# Patient Record
Sex: Female | Born: 1980
Health system: Southern US, Community
[De-identification: ages and names within clinical notes are randomized; demographics above are authoritative.]

## PROBLEM LIST (undated history)

## (undated) DIAGNOSIS — J45909 Unspecified asthma, uncomplicated: Secondary | ICD-10-CM

---

## 2010-08-02 ENCOUNTER — Emergency Department: Payer: Self-pay | Admitting: Emergency Medicine

## 2010-10-11 ENCOUNTER — Emergency Department: Payer: Self-pay | Admitting: Emergency Medicine

## 2013-08-30 ENCOUNTER — Emergency Department: Payer: Self-pay | Admitting: Emergency Medicine

## 2013-09-28 ENCOUNTER — Emergency Department: Payer: Self-pay | Admitting: Internal Medicine

## 2013-11-09 ENCOUNTER — Emergency Department: Payer: Self-pay | Admitting: Emergency Medicine

## 2013-11-18 ENCOUNTER — Emergency Department: Payer: Self-pay | Admitting: Emergency Medicine

## 2013-11-28 ENCOUNTER — Emergency Department: Payer: Self-pay | Admitting: Emergency Medicine

## 2014-01-17 ENCOUNTER — Emergency Department: Payer: Self-pay

## 2014-01-17 LAB — URINALYSIS, COMPLETE
BLOOD: NEGATIVE
Bacteria: NONE SEEN
Glucose,UR: NEGATIVE mg/dL (ref 0–75)
LEUKOCYTE ESTERASE: NEGATIVE
Nitrite: NEGATIVE
PH: 6 (ref 4.5–8.0)
Protein: NEGATIVE
RBC,UR: 2 /HPF (ref 0–5)
SPECIFIC GRAVITY: 1.024 (ref 1.003–1.030)
Squamous Epithelial: 4
WBC UR: 3 /HPF (ref 0–5)

## 2014-05-11 ENCOUNTER — Emergency Department: Payer: Self-pay | Admitting: Emergency Medicine

## 2014-06-03 ENCOUNTER — Emergency Department: Payer: Self-pay | Admitting: Emergency Medicine

## 2014-06-07 ENCOUNTER — Emergency Department: Payer: Self-pay | Admitting: Emergency Medicine

## 2014-08-12 DIAGNOSIS — J4520 Mild intermittent asthma, uncomplicated: Secondary | ICD-10-CM | POA: Insufficient documentation

## 2014-08-12 DIAGNOSIS — Z720 Tobacco use: Secondary | ICD-10-CM | POA: Insufficient documentation

## 2014-08-17 ENCOUNTER — Emergency Department
Admit: 2014-08-17 | Disposition: A | Discharge status: Home / Self Care | Payer: Self-pay | Admitting: Emergency Medicine

## 2014-08-17 LAB — URINALYSIS, COMPLETE
Bilirubin,UR: NEGATIVE
Glucose,UR: NEGATIVE mg/dL (ref 0–75)
KETONE: NEGATIVE
Leukocyte Esterase: NEGATIVE
Nitrite: NEGATIVE
PROTEIN: NEGATIVE
Ph: 5 (ref 4.5–8.0)
SPECIFIC GRAVITY: 1.024 (ref 1.003–1.030)

## 2014-08-17 LAB — CBC
HCT: 38.6 % (ref 35.0–47.0)
HGB: 13.1 g/dL (ref 12.0–16.0)
MCH: 32.4 pg (ref 26.0–34.0)
MCHC: 33.8 g/dL (ref 32.0–36.0)
MCV: 96 fL (ref 80–100)
Platelet: 227 10*3/uL (ref 150–440)
RBC: 4.03 10*6/uL (ref 3.80–5.20)
RDW: 13.5 % (ref 11.5–14.5)
WBC: 7.7 10*3/uL (ref 3.6–11.0)

## 2014-08-17 LAB — WET PREP, GENITAL

## 2015-03-12 ENCOUNTER — Emergency Department: Payer: Self-pay

## 2015-03-12 ENCOUNTER — Emergency Department
Admission: EM | Admit: 2015-03-12 | Discharge: 2015-03-12 | Disposition: A | Discharge status: Home / Self Care | Payer: Self-pay | Attending: Emergency Medicine | Admitting: Emergency Medicine

## 2015-03-12 DIAGNOSIS — Y9389 Activity, other specified: Secondary | ICD-10-CM | POA: Insufficient documentation

## 2015-03-12 DIAGNOSIS — Y9289 Other specified places as the place of occurrence of the external cause: Secondary | ICD-10-CM | POA: Insufficient documentation

## 2015-03-12 DIAGNOSIS — Y998 Other external cause status: Secondary | ICD-10-CM | POA: Insufficient documentation

## 2015-03-12 DIAGNOSIS — S63601A Unspecified sprain of right thumb, initial encounter: Secondary | ICD-10-CM | POA: Insufficient documentation

## 2015-03-12 DIAGNOSIS — W010XXA Fall on same level from slipping, tripping and stumbling without subsequent striking against object, initial encounter: Secondary | ICD-10-CM | POA: Insufficient documentation

## 2015-03-12 NOTE — ED Notes (Signed)
Pt states she was moving her living room around had the couch standing up states she tripped over her dog and the cough fell she tried to catch it and when that happened her thumb "went all the way back" pt states she is able to move it but it hurts "a lot"

## 2015-03-12 NOTE — ED Provider Notes (Signed)
[facility] Emergency Department Provider Note ____________________________________________  Time seen: Approximately 3:31 PM  I have reviewed the triage vital signs and the nursing notes.   HISTORY  Chief Complaint Hand Pain  HPI Toni Armstrong is a 34 y.o. female is here with complaint of right thumb pain. Patient states that she was moving about her living room when she tripped over her dog and she fell trying to catch herself. There was an injury against her thumb causing her thumb to hyperextend. Patient states she has not had a fracture to her thumb. Patient continues to have pain in this area. Pain is constant, nonradiatingand increases with range of motion. Patient states she has not taken any over-the-counter medication prior to her arrival in the emergency room. She rates her pain is 7 out of 10. Patient denies any head injury or loss of consciousness during this accident.   No past medical history on file.  There are no active problems to display for this patient.   No past surgical history on file.  No current outpatient prescriptions on file.  Allergies Review of patient's allergies indicates no known allergies.  No family history on file.  Social History Social History  Substance Use Topics  . Smoking status: Not on file  . Smokeless tobacco: Not on file  . Alcohol Use: Not on file    Review of Systems Constitutional: No fever/chills Eyes: No visual changes. ENT: No sore throat. Cardiovascular: Denies chest pain. Respiratory: Denies shortness of breath. Gastrointestinal:   No nausea, no vomiting.   Musculoskeletal: Negative for back pain. Positive right hand pain. Skin: Negative for rash. Neurological: Negative for headaches, focal weakness or numbness.  10-point ROS otherwise negative.  ____________________________________________   PHYSICAL EXAM:  VITAL SIGNS: ED Triage Vitals  Enc Vitals Group     BP 03/12/15 1510  129/76 mmHg     Pulse Rate 03/12/15 1510 84     Resp 03/12/15 1510 18     Temp 03/12/15 1510 98.1 F (36.7 C)     Temp Source 03/12/15 1510 Oral     SpO2 03/12/15 1510 100 %     Weight 03/12/15 1510 218 lb (98.884 kg)     Height 03/12/15 1510 5\' 6"  (1.676 m)     Head Cir --      Peak Flow --      Pain Score 03/12/15 1506 7     Pain Loc --      Pain Edu? --      Excl. in GC? --     Constitutional: Alert and oriented. Well appearing and in no acute distress. Eyes: Conjunctivae are normal. PERRL. EOMI. Head: Atraumatic. Nose: No congestion/rhinnorhea. Mouth/Throat: Mucous membranes are moist.  Oropharynx non-erythematous. Neck: No stridor. Cardiovascular: Normal rate, regular rhythm. Grossly normal heart sounds.  Good peripheral circulation. Respiratory: Normal respiratory effort.  No retractions. Lungs CTAB. Gastrointestinal: Soft and nontender. No distention. No abdominal bruits. No CVA tenderness. Musculoskeletal: Right hand no gross deformity. There is decreased range of motion in the right thumb secondary to pain. There is minimal edema present in the right thumb. Motor sensory function intact. Neurologic:  Normal speech and language. No gross focal neurologic deficits are appreciated. No gait instability. Skin:  Skin is warm, dry and intact. No rash noted. Right thumb no ecchymosis, erythema, or abrasions were noted. Psychiatric: Mood and affect are normal. Speech and behavior are normal.  ____________________________________________   LABS (all labs ordered are listed, but only abnormal  results are displayed)  Labs Reviewed - No data to display  RADIOLOGY  X-ray right hand per radiologist does show any fracture or dislocation. I, Tommi Rumps, personally viewed and evaluated these images (plain radiographs) as part of my medical decision making.  ____________________________________________   PROCEDURES  Procedure(s) performed: None  Critical Care performed:  No  ____________________________________________   INITIAL IMPRESSION / ASSESSMENT AND PLAN / ED COURSE  Pertinent labs & imaging results that were available during my care of the patient were reviewed by me and considered in my medical decision making (see chart for details).  Patient's hand was wrapped with an Ace wrap. Patient is to ice and elevate hand as needed for pain and swelling. She is instructed to use ibuprofen as needed for pain. She is follow-up with [facility] clinic if any continued problems.   ____________________________________________   FINAL CLINICAL IMPRESSION(S) / ED DIAGNOSES  Final diagnoses:  Sprain of right thumb, initial encounter      Tommi Rumps, PA-C 03/12/15 1621  Emily Filbert, MD 03/13/15 1517

## 2015-03-12 NOTE — ED Notes (Signed)
Pt to triage with reports of rt hand thumb pain since moving a couch.

## 2015-03-12 NOTE — Discharge Instructions (Signed)
Finger Sprain A finger sprain is a tear in one of the strong, fibrous tissues that connect the bones (ligaments) in your finger. The severity of the sprain depends on how much of the ligament is torn. The tear can be either partial or complete. CAUSES  Often, sprains are a result of a fall or accident. If you extend your hands to catch an object or to protect yourself, the force of the impact causes the fibers of your ligament to stretch too much. This excess tension causes the fibers of your ligament to tear. SYMPTOMS  You may have some loss of motion in your finger. Other symptoms include:  Bruising.  Tenderness.  Swelling. DIAGNOSIS  In order to diagnose finger sprain, your caregiver will physically examine your finger or thumb to determine how torn the ligament is. Your caregiver may also suggest an X-ray exam of your finger to make sure no bones are broken. TREATMENT  If your ligament is only partially torn, treatment usually involves keeping the finger in a fixed position (immobilization) for a short period. To do this, your caregiver will apply a bandage, cast, or splint to keep your finger from moving until it heals. For a partially torn ligament, the healing process usually takes 2 to 3 weeks. If your ligament is completely torn, you may need surgery to reconnect the ligament to the bone. After surgery a cast or splint will be applied and will need to stay on your finger or thumb for 4 to 6 weeks while your ligament heals. HOME CARE INSTRUCTIONS  Keep your injured finger elevated, when possible, to decrease swelling.  To ease pain and swelling, apply ice to your joint twice a day, for 2 to 3 days:  Put ice in a plastic bag.  Place a towel between your skin and the bag.  Leave the ice on for 15 minutes.  Only take over-the-counter or prescription medicine for pain as directed by your caregiver.  Do not wear rings on your injured finger.  Do not leave your finger unprotected  until pain and stiffness go away (usually 3 to 4 weeks).  Do not allow your cast or splint to get wet. Cover your cast or splint with a plastic bag when you shower or bathe. Do not swim.  Your caregiver may suggest special exercises for you to do during your recovery to prevent or limit permanent stiffness. SEEK IMMEDIATE MEDICAL CARE IF:  Your cast or splint becomes damaged.  Your pain becomes worse rather than better. MAKE SURE YOU:  Understand these instructions.  Will watch your condition.  Will get help right away if you are not doing well or get worse.   This information is not intended to replace advice given to you by your health care provider. Make sure you discuss any questions you have with your health care provider.   Document Released: 05/25/2004 Document Revised: 05/08/2014 Document Reviewed: 12/19/2010 Elsevier Interactive Patient Education 2016 [employer]    ICE AND ELEVATE YOUR HAND IBUPROFEN AS NEEDED FOR PAIN

## 2015-05-18 ENCOUNTER — Emergency Department
Admission: EM | Admit: 2015-05-18 | Discharge: 2015-05-18 | Disposition: A | Discharge status: Home / Self Care | Payer: BLUE CROSS/BLUE SHIELD | Attending: Emergency Medicine | Admitting: Emergency Medicine

## 2015-05-18 ENCOUNTER — Encounter: Payer: Self-pay | Admitting: *Deleted

## 2015-05-18 DIAGNOSIS — R0602 Shortness of breath: Secondary | ICD-10-CM | POA: Diagnosis present

## 2015-05-18 DIAGNOSIS — J45901 Unspecified asthma with (acute) exacerbation: Secondary | ICD-10-CM | POA: Diagnosis not present

## 2015-05-18 DIAGNOSIS — F17200 Nicotine dependence, unspecified, uncomplicated: Secondary | ICD-10-CM | POA: Diagnosis not present

## 2015-05-18 HISTORY — DX: Unspecified asthma, uncomplicated: J45.909

## 2015-05-18 MED ORDER — PREDNISONE 20 MG PO TABS: 60.0000 mg | ORAL_TABLET | Freq: Every day | ORAL | Status: DC

## 2015-05-18 MED ORDER — IPRATROPIUM-ALBUTEROL 0.5-2.5 (3) MG/3ML IN SOLN
9.0000 mL | Freq: Once | RESPIRATORY_TRACT | Status: AC
  Administered 2015-05-18: 9 mL via RESPIRATORY_TRACT
  Filled 2015-05-18: qty 9

## 2015-05-18 MED ORDER — PREDNISONE 20 MG PO TABS
60.0000 mg | ORAL_TABLET | Freq: Once | ORAL | Status: AC
  Administered 2015-05-18: 60 mg via ORAL
  Filled 2015-05-18: qty 3

## 2015-05-18 NOTE — ED Notes (Signed)
At work developed sob used her inhaler and went outside, her sob is some better, yesterday had sorethroat

## 2015-05-18 NOTE — Discharge Instructions (Signed)
Asthma, Adult Asthma is a condition of the lungs in which the airways tighten and narrow. Asthma can make it hard to breathe. Asthma cannot be cured, but medicine and lifestyle changes can help control it. Asthma may be started (triggered) by:  Animal skin flakes (dander).  Dust.  Cockroaches.  Pollen.  Mold.  Smoke.  Cleaning products.  Hair sprays or aerosol sprays.  Paint fumes or strong smells.  Cold air, weather changes, and winds.  Crying or laughing hard.  Stress.  Certain medicines or drugs.  Foods, such as dried fruit, potato chips, and sparkling grape juice.  Infections or conditions (colds, flu).  Exercise.  Certain medical conditions or diseases.  Exercise or tiring activities. HOME CARE   Take medicine as told by your doctor.  Use a peak flow meter as told by your doctor. A peak flow meter is a tool that measures how well the lungs are working.  Record and keep track of the peak flow meter's readings.  Understand and use the asthma action plan. An asthma action plan is a written plan for taking care of your asthma and treating your attacks.  To help prevent asthma attacks:  Do not smoke. Stay away from secondhand smoke.  Change your heating and air conditioning filter often.  Limit your use of fireplaces and wood stoves.  Get rid of pests (such as roaches and mice) and their droppings.  Throw away plants if you see mold on them.  Clean your floors. Dust regularly. Use cleaning products that do not smell.  Have someone vacuum when you are not home. Use a vacuum cleaner with a HEPA filter if possible.  Replace carpet with wood, tile, or vinyl flooring. Carpet can trap animal skin flakes and dust.  Use allergy-proof pillows, mattress covers, and box spring covers.  Wash bed sheets and blankets every week in hot water and dry them in a dryer.  Use blankets that are made of polyester or cotton.  Clean bathrooms and kitchens with bleach.  If possible, have someone repaint the walls in these rooms with mold-resistant paint. Keep out of the rooms that are being cleaned and painted.  Wash hands often. GET HELP IF:  You have make a whistling sound when breaking (wheeze), have shortness of breath, or have a cough even if taking medicine to prevent attacks.  The colored mucus you cough up (sputum) is thicker than usual.  The colored mucus you cough up changes from clear or white to yellow, green, gray, or bloody.  You have problems from the medicine you are taking such as:  A rash.  Itching.  Swelling.  Trouble breathing.  You need reliever medicines more than 2-3 times a week.  Your peak flow measurement is still at 50-79% of your personal best after following the action plan for 1 hour.  You have a fever. GET HELP RIGHT AWAY IF:   You seem to be worse and are not responding to medicine during an asthma attack.  You are short of breath even at rest.  You get short of breath when doing very little activity.  You have trouble eating, drinking, or talking.  You have chest pain.  You have a fast heartbeat.  Your lips or fingernails start to turn blue.  You are light-headed, dizzy, or faint.  Your peak flow is less than 50% of your personal best.   This information is not intended to replace advice given to you by your health care provider. Make sure   you discuss any questions you have with your health care provider.   Document Released: 10/04/2007 Document Revised: 01/06/2015 Document Reviewed: 11/14/2012 Elsevier Interactive Patient Education 2016 Elsevier Inc.  

## 2015-05-18 NOTE — ED Notes (Signed)
States breathing much easier, lungs clear

## 2015-05-18 NOTE — ED Provider Notes (Addendum)
[facility] Emergency Department Provider Note  ____________________________________________  Time seen: Seen upon arrival to the emergency department  I have reviewed the triage vital signs and the nursing notes.   HISTORY  Chief Complaint Shortness of Breath    HPI Toni Armstrong is a 35 y.o. female with a history of asthma who is presenting today with shortness of breath that started suddenly at work just prior to arrival. She denies having cough or runny nose but did say [place] get a cold. She denies any chest pain at this time. Said that she was standing at work when her shortness of breath started suddenly. She then took 4 puffs off of her inhaler and has had significant improvement in her shortness of breath. EMS said that she was not wheezing for them and has not required any oxygen in route. The patient says that the sudden onset of shortness of breath is typical of her past asthma attacks and has had these presentations multiple times previously. She said that she had a mild sore throat yesterday but it has resolved and there is no pain today.The patient says that her only pain is a tightness in her face which she says that she gets whenever she has her asthma exacerbations.   Past Medical History  Diagnosis Date  . Asthma   . Seizures (HCC)     There are no active problems to display for this patient.   History reviewed. No pertinent past surgical history.  No current outpatient prescriptions on file.  Allergies Review of patient's allergies indicates no known allergies.  No family history on file.  Social History Social History  Substance Use Topics  . Smoking status: Current Every Day Smoker  . Smokeless tobacco: None  . Alcohol Use: No    Review of Systems Constitutional: No fever/chills Eyes: No visual changes. ENT: As above Cardiovascular: Denies chest  pain. Respiratory: As above  Gastrointestinal: No abdominal pain.  No nausea, no vomiting.  No diarrhea.  No constipation. Genitourinary: Negative for dysuria. Musculoskeletal: Negative for back pain. Skin: Negative for rash. Neurological: Negative forf ocal weakness or numbness.  10-point ROS otherwise negative.  ____________________________________________   PHYSICAL EXAM:  VITAL SIGNS: ED Triage Vitals  Enc Vitals Group     BP 05/18/15 0840 128/74 mmHg     Pulse Rate 05/18/15 0840 87     Resp 05/18/15 0840 23     Temp 05/18/15 0840 97.7 F (36.5 C)     Temp Source 05/18/15 0840 Oral     SpO2 05/18/15 0840 100 %     Weight 05/18/15 0840 220 lb (99.791 kg)     Height 05/18/15 0840  (1.676 m)     Head Cir --      Peak Flow --      Pain Score --      Pain Loc --      Pain Edu? --      Excl. in GC? --     Constitutional: Alert and oriented. Well appearing and in no acute distress. Eyes: Conjunctivae are normal. PERRL. EOMI. Head: Atraumatic. Nose: No congestion/rhinnorhea. Mouth/Throat: Mucous membranes are moist.  Oropharynx non-erythematous. Neck: No stridor.   Cardiovascular: Normal rate, regular rhythm. Grossly normal heart sounds.  Good peripheral circulation. Respiratory: Mild tachypnea but without any labored respirations .  No retractions. Lungs CTAB but with a prolonged expiratory phase. Gastrointestinal: Soft  and nontender. No distention. No abdominal bruits. No CVA tenderness. Musculoskeletal: No lower extremity tenderness nor edema.  No joint effusions. Neurologic:  Normal speech and language. No gross focal neurologic deficits are appreciated. No gait instability. Skin:  Skin is warm, dry and intact. No rash noted. Psychiatric: Mood and affect are normal. Speech and behavior are normal.  ____________________________________________   LABS (all labs ordered are listed, but only abnormal results are displayed)  Labs Reviewed - No data to  display ____________________________________________  EKG   ____________________________________________  RADIOLOGY   ____________________________________________   PROCEDURES    ____________________________________________   INITIAL IMPRESSION / ASSESSMENT AND PLAN / ED COURSE  Pertinent labs & imaging results that were available during my care of the patient were reviewed by me and considered in my medical decision making (see chart for details). Symptoms typical of previous asthma flares. We'll treat with nebulizers as well as steroids and reassess.  ----------------------------------------- 10:30 AM on 05/18/2015 -----------------------------------------  Patient now feeling back to baseline. Respiratory rate of 18. No pain reported at this time. We auscultated her lungs and they are clear without any wheezing. There is no prolonged expiration phase. She is not in respiratory distress and is speaking in full sentences. She has had multiple previous presentations of her asthma which is been identical. I'll give her steroids to go home with. She has an inhaler at home already that she will use as needed. She does not report a care doctor but does have insurance to make it easier for her to follow-up. [facility] and I'll give her the number that she may make an appointment. She'll send the plan is when to comply. Nose she may return to the emergency department for any worsening or concerning symptoms at any time. ____________________________________________   FINAL CLINICAL IMPRESSION(S) / ED DIAGNOSES  Acute asthma exacerbation.    Myrna Blazer, MD 05/18/15 1031  Patient with mild tachycardia but likely secondary to nebulizer treatments.  Myrna Blazer, MD 05/18/15 [phone]

## 2015-05-21 ENCOUNTER — Emergency Department
Admission: EM | Admit: 2015-05-21 | Discharge: 2015-05-21 | Disposition: A | Discharge status: Home / Self Care | Payer: BLUE CROSS/BLUE SHIELD | Attending: Emergency Medicine | Admitting: Emergency Medicine

## 2015-05-21 ENCOUNTER — Emergency Department: Payer: BLUE CROSS/BLUE SHIELD

## 2015-05-21 DIAGNOSIS — F17200 Nicotine dependence, unspecified, uncomplicated: Secondary | ICD-10-CM | POA: Insufficient documentation

## 2015-05-21 DIAGNOSIS — J069 Acute upper respiratory infection, unspecified: Secondary | ICD-10-CM | POA: Insufficient documentation

## 2015-05-21 DIAGNOSIS — Z7952 Long term (current) use of systemic steroids: Secondary | ICD-10-CM | POA: Diagnosis not present

## 2015-05-21 DIAGNOSIS — J45901 Unspecified asthma with (acute) exacerbation: Secondary | ICD-10-CM | POA: Diagnosis not present

## 2015-05-21 DIAGNOSIS — R0981 Nasal congestion: Secondary | ICD-10-CM | POA: Diagnosis present

## 2015-05-21 MED ORDER — DM-GUAIFENESIN ER 30-600 MG PO TB12
1.0000 | ORAL_TABLET | ORAL | Status: DC
  Filled 2015-05-21: qty 1

## 2015-05-21 MED ORDER — AZITHROMYCIN 250 MG PO TABS
500.0000 mg | ORAL_TABLET | Freq: Once | ORAL | Status: AC
  Administered 2015-05-21: 500 mg via ORAL
  Filled 2015-05-21: qty 2

## 2015-05-21 MED ORDER — DM-GUAIFENESIN ER 30-600 MG PO TB12: 1.0000 | ORAL_TABLET | Freq: Two times a day (BID) | ORAL | Status: DC

## 2015-05-21 MED ORDER — DM-GUAIFENESIN ER 30-600 MG PO TB12: 1.0000 | ORAL_TABLET | Freq: Two times a day (BID) | ORAL | Status: DC | PRN

## 2015-05-21 MED ORDER — GUAIFENESIN ER 600 MG PO TB12
600.0000 mg | ORAL_TABLET | Freq: Once | ORAL | Status: DC
  Filled 2015-05-21: qty 1

## 2015-05-21 MED ORDER — AZITHROMYCIN 500 MG PO TABS: 500.0000 mg | ORAL_TABLET | Freq: Every day | ORAL | Status: AC

## 2015-05-21 NOTE — ED Notes (Signed)
Pt in with co congestion, shob, vomiting, and chills. Pt is currently on prednisone for asthma attack Tuesday, she was seen here.

## 2015-05-21 NOTE — ED Provider Notes (Signed)
[facility] Emergency Department Provider Note  ____________________________________________  Time seen: 3:40AM  I have reviewed the triage vital signs and the nursing notes.   HISTORY  Chief Complaint Nasal Congestion     HPI Toni Armstrong is a 35 y.o. female presents with nasal congestion and dyspnea and chills 3 days. Patient was seen emergency Department on Tuesday 3 days ago for an asthma attack and prescribed prednisone. Patient states symptoms have not improved and congestion has become worse.    Past Medical History  Diagnosis Date  . Asthma   . Seizures (HCC)     There are no active problems to display for this patient.   No past surgical history on file.  Current Outpatient Rx  Name  Route  Sig  Dispense  Refill  . predniSONE (DELTASONE) 20 MG tablet   Oral   Take 3 tablets (60 mg total) by mouth daily with breakfast.   12 tablet   0     Allergies Review of patient's allergies indicates no known allergies.  No family history on file.  Social History Social History  Substance Use Topics  . Smoking status: Current Every Day Smoker  . Smokeless tobacco: Not on file  . Alcohol Use: No    Review of Systems  Constitutional: Negative for fever. Positive for chills Eyes: Negative for visual changes. ENT: Negative for sore throat. Positive for nasal congestion Cardiovascular: Negative for chest pain. Respiratory: Negative for shortness of breath. Positive for cough Gastrointestinal: Negative for abdominal pain, vomiting and diarrhea. Genitourinary: Negative for dysuria. Musculoskeletal: Negative for back pain. Skin: Negative for rash. Neurological: Negative for headaches, focal weakness or numbness.   10-point ROS otherwise negative.  ____________________________________________   PHYSICAL EXAM:  VITAL SIGNS: ED Triage Vitals  Enc Vitals Group     BP 05/21/15 0321 121/73 mmHg     Pulse Rate 05/21/15 0321 77   Resp 05/21/15 0321 18     Temp 05/21/15 0321 97.9 F (36.6 C)     Temp Source 05/21/15 0321 Oral     SpO2 05/21/15 0321 100 %     Weight 05/21/15 0321 220 lb (99.791 kg)     Height 05/21/15 0321  (1.676 m)     Head Cir --      Peak Flow --      Pain Score 05/21/15 0322 6     Pain Loc --      Pain Edu? --      Excl. in GC? --      Constitutional: Alert and oriented. Well appearing and in no distress. Eyes: Conjunctivae are normal. PERRL. Normal extraocular movements. ENT   Head: Normocephalic and atraumatic.   Nose: No congestion/rhinnorhea.   Mouth/Throat: Mucous membranes are moist.   Neck: No stridor. Hematological/Lymphatic/Immunilogical: No cervical lymphadenopathy. Cardiovascular: Normal rate, regular rhythm. Normal and symmetric distal pulses are present in all extremities. No murmurs, rubs, or gallops. Respiratory: Normal respiratory effort without tachypnea nor retractions. Breath sounds are clear and equal bilaterally. No wheezes/rales/rhonchi. Gastrointestinal: Soft and nontender. No distention. There is no CVA tenderness. Genitourinary: deferred Musculoskeletal: Nontender with normal range of motion in all extremities. No joint effusions.  No lower extremity tenderness nor edema. Neurologic:  Normal speech and language. No gross focal neurologic deficits are appreciated. Speech is normal.  Skin:  Skin is warm, dry and intact. No rash noted. Psychiatric: Mood and affect are normal. Speech and behavior are normal. Patient exhibits appropriate insight and judgment.  RADIOLOGY  DG Chest 2 View (Final result) Result time: 05/21/15 04:27:41   Final result by Rad Results In Interface (05/21/15 04:27:41)   Narrative:   CLINICAL DATA: Congestion, shortness of breath, vomiting and chills. On prednisone for asthma attack.  EXAM: CHEST 2 VIEW  COMPARISON: Chest radiograph May 11, 2014  FINDINGS: Cardiomediastinal silhouette is normal. Mild  bronchitic changes. The lungs are clear without pleural effusions or focal consolidations. Trachea projects midline and there is no pneumothorax. Soft tissue planes and included osseous structures are normal.  IMPRESSION: Mild bronchitic changes can be seen with reactive airway disease or bronchiolitis without focal consolidation.   Electronically Signed By: Awilda Metro M.D. On: 05/21/2015 04:27          INITIAL IMPRESSION / ASSESSMENT AND PLAN / ED COURSE  Pertinent labs & imaging results that were available during my care of the patient were reviewed by me and considered in my medical decision making (see chart for details).  Patient received azithromycin and Mucinex and will be prescribed the same for home  ____________________________________________   FINAL CLINICAL IMPRESSION(S) / ED DIAGNOSES  Final diagnoses:  URI (upper respiratory infection)   Bronchitis    Darci Current, MD 05/21/15 [phone]

## 2015-05-21 NOTE — Discharge Instructions (Signed)
Upper Respiratory Infection, Adult Most upper respiratory infections (URIs) are a viral infection of the air passages leading to the lungs. A URI affects the nose, throat, and upper air passages. The most common type of URI is nasopharyngitis and is typically referred to as "the common cold." URIs run their course and usually go away on their own. Most of the time, a URI does not require medical attention, but sometimes a bacterial infection in the upper airways can follow a viral infection. This is called a secondary infection. Sinus and middle ear infections are common types of secondary upper respiratory infections. Bacterial pneumonia can also complicate a URI. A URI can worsen asthma and chronic obstructive pulmonary disease (COPD). Sometimes, these complications can require emergency medical care and may be life threatening.  CAUSES Almost all URIs are caused by viruses. A virus is a type of germ and can spread from one person to another.  RISKS FACTORS You may be at risk for a URI if:   You smoke.   You have chronic heart or lung disease.  You have a weakened defense (immune) system.   You are very young or very old.   You have nasal allergies or asthma.  You work in crowded or poorly ventilated areas.  You work in health care facilities or schools. SIGNS AND SYMPTOMS  Symptoms typically develop 2-3 days after you come in contact with a cold virus. Most viral URIs last 7-10 days. However, viral URIs from the influenza virus (flu virus) can last 14-18 days and are typically more severe. Symptoms may include:   Runny or stuffy (congested) nose.   Sneezing.   Cough.   Sore throat.   Headache.   Fatigue.   Fever.   Loss of appetite.   Pain in your forehead, behind your eyes, and over your cheekbones (sinus pain).  Muscle aches.  DIAGNOSIS  Your health care provider may diagnose a URI by:  Physical exam.  Tests to check that your symptoms are not due to  another condition such as:  Strep throat.  Sinusitis.  Pneumonia.  Asthma. TREATMENT  A URI goes away on its own with time. It cannot be cured with medicines, but medicines may be prescribed or recommended to relieve symptoms. Medicines may help:  Reduce your fever.  Reduce your cough.  Relieve nasal congestion. HOME CARE INSTRUCTIONS   Take medicines only as directed by your health care provider.   Gargle warm saltwater or take cough drops to comfort your throat as directed by your health care provider.  Use a warm mist humidifier or inhale steam from a shower to increase air moisture. This may make it easier to breathe.  Drink enough fluid to keep your urine clear or pale yellow.   Eat soups and other clear broths and maintain good nutrition.   Rest as needed.   Return to work when your temperature has returned to normal or as your health care provider advises. You may need to stay home longer to avoid infecting others. You can also use a face mask and careful hand washing to prevent spread of the virus.  Increase the usage of your inhaler if you have asthma.   Do not use any tobacco products, including cigarettes, chewing tobacco, or electronic cigarettes. If you need help quitting, ask your health care provider. PREVENTION  The best way to protect yourself from getting a cold is to practice good hygiene.   Avoid oral or hand contact with people with cold   symptoms.   Wash your hands often if contact occurs.  There is no clear evidence that vitamin C, vitamin E, echinacea, or exercise reduces the chance of developing a cold. However, it is always recommended to get plenty of rest, exercise, and practice good nutrition.  SEEK MEDICAL CARE IF:   You are getting worse rather than better.   Your symptoms are not controlled by medicine.   You have chills.  You have worsening shortness of breath.  You have Mical Kicklighter or red mucus.  You have yellow or Arleny Kruger nasal  discharge.  You have pain in your face, especially when you bend forward.  You have a fever.  You have swollen neck glands.  You have pain while swallowing.  You have white areas in the back of your throat. SEEK IMMEDIATE MEDICAL CARE IF:   You have severe or persistent:  Headache.  Ear pain.  Sinus pain.  Chest pain.  You have chronic lung disease and any of the following:  Wheezing.  Prolonged cough.  Coughing up blood.  A change in your usual mucus.  You have a stiff neck.  You have changes in your:  Vision.  Hearing.  Thinking.  Mood. MAKE SURE YOU:   Understand these instructions.  Will watch your condition.  Will get help right away if you are not doing well or get worse.   This information is not intended to replace advice given to you by your health care provider. Make sure you discuss any questions you have with your health care provider.   Document Released: 10/11/2000 Document Revised: 09/01/2014 Document Reviewed: 07/23/2013 Elsevier Interactive Patient Education 2016 [employer]vier Inc.  

## 2016-02-29 ENCOUNTER — Emergency Department: Payer: PRIVATE HEALTH INSURANCE

## 2016-02-29 ENCOUNTER — Emergency Department
Admission: EM | Admit: 2016-02-29 | Discharge: 2016-02-29 | Disposition: A | Discharge status: Home / Self Care | Payer: PRIVATE HEALTH INSURANCE | Attending: Emergency Medicine | Admitting: Emergency Medicine

## 2016-02-29 ENCOUNTER — Encounter: Payer: Self-pay | Admitting: Emergency Medicine

## 2016-02-29 DIAGNOSIS — Z79899 Other long term (current) drug therapy: Secondary | ICD-10-CM | POA: Diagnosis not present

## 2016-02-29 DIAGNOSIS — J45909 Unspecified asthma, uncomplicated: Secondary | ICD-10-CM | POA: Diagnosis not present

## 2016-02-29 DIAGNOSIS — Y999 Unspecified external cause status: Secondary | ICD-10-CM | POA: Insufficient documentation

## 2016-02-29 DIAGNOSIS — S0990XA Unspecified injury of head, initial encounter: Secondary | ICD-10-CM | POA: Diagnosis present

## 2016-02-29 DIAGNOSIS — Y9389 Activity, other specified: Secondary | ICD-10-CM | POA: Insufficient documentation

## 2016-02-29 DIAGNOSIS — M25562 Pain in left knee: Secondary | ICD-10-CM | POA: Diagnosis not present

## 2016-02-29 DIAGNOSIS — M25561 Pain in right knee: Secondary | ICD-10-CM | POA: Diagnosis not present

## 2016-02-29 DIAGNOSIS — F17200 Nicotine dependence, unspecified, uncomplicated: Secondary | ICD-10-CM | POA: Insufficient documentation

## 2016-02-29 DIAGNOSIS — Y92410 Unspecified street and highway as the place of occurrence of the external cause: Secondary | ICD-10-CM | POA: Insufficient documentation

## 2016-02-29 DIAGNOSIS — S060X0A Concussion without loss of consciousness, initial encounter: Secondary | ICD-10-CM | POA: Insufficient documentation

## 2016-02-29 MED ORDER — HYDROCODONE-ACETAMINOPHEN 5-325 MG PO TABS: 1.0000 | ORAL_TABLET | Freq: Four times a day (QID) | ORAL | 0 refills | Status: DC | PRN

## 2016-02-29 MED ORDER — HYDROCODONE-ACETAMINOPHEN 5-325 MG PO TABS
2.0000 | ORAL_TABLET | ORAL | Status: AC
  Administered 2016-02-29: 2 via ORAL
  Filled 2016-02-29: qty 2
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
## 2016-02-29 NOTE — ED Notes (Signed)
AAOx3.  Skin warm and dry.  Abrasion to top center of forehead.  Moving all extremities equally and strong.

## 2016-03-13 ENCOUNTER — Emergency Department
Admission: EM | Admit: 2016-03-13 | Discharge: 2016-03-13 | Disposition: A | Discharge status: Home / Self Care | Payer: BLUE CROSS/BLUE SHIELD | Attending: Emergency Medicine | Admitting: Emergency Medicine

## 2016-03-13 ENCOUNTER — Encounter: Payer: Self-pay | Admitting: Intensive Care

## 2016-03-13 DIAGNOSIS — R112 Nausea with vomiting, unspecified: Secondary | ICD-10-CM | POA: Insufficient documentation

## 2016-03-13 DIAGNOSIS — R51 Headache: Secondary | ICD-10-CM | POA: Insufficient documentation

## 2016-03-13 DIAGNOSIS — J45909 Unspecified asthma, uncomplicated: Secondary | ICD-10-CM | POA: Insufficient documentation

## 2016-03-13 DIAGNOSIS — R519 Headache, unspecified: Secondary | ICD-10-CM

## 2016-03-13 DIAGNOSIS — F17210 Nicotine dependence, cigarettes, uncomplicated: Secondary | ICD-10-CM | POA: Insufficient documentation

## 2016-03-13 DIAGNOSIS — Z79899 Other long term (current) drug therapy: Secondary | ICD-10-CM | POA: Insufficient documentation

## 2016-03-13 MED ORDER — PROCHLORPERAZINE EDISYLATE 5 MG/ML IJ SOLN
10.0000 mg | Freq: Once | INTRAMUSCULAR | Status: AC
  Administered 2016-03-13: 10 mg via INTRAVENOUS
  Filled 2016-03-13: qty 2
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
## 2016-03-13 NOTE — Discharge Instructions (Addendum)
Please follow-up with neurology for your headaches. I have [facility]UB [facility]. I've emailed her your contact information. She should set up a follow-up appointment for you. I have also included our neurologist name and contact information. You can try calling him. Since [facility] it will probably be easier for [facility].
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
## 2016-03-16 ENCOUNTER — Encounter: Payer: Self-pay | Admitting: Emergency Medicine

## 2016-03-16 ENCOUNTER — Emergency Department
Admission: EM | Admit: 2016-03-16 | Discharge: 2016-03-16 | Disposition: A | Discharge status: Home / Self Care | Payer: Self-pay | Attending: Emergency Medicine | Admitting: Emergency Medicine

## 2016-03-16 ENCOUNTER — Emergency Department: Payer: Self-pay

## 2016-03-16 DIAGNOSIS — Z791 Long term (current) use of non-steroidal anti-inflammatories (NSAID): Secondary | ICD-10-CM | POA: Insufficient documentation

## 2016-03-16 DIAGNOSIS — F17210 Nicotine dependence, cigarettes, uncomplicated: Secondary | ICD-10-CM | POA: Insufficient documentation

## 2016-03-16 DIAGNOSIS — N3000 Acute cystitis without hematuria: Secondary | ICD-10-CM | POA: Insufficient documentation

## 2016-03-16 DIAGNOSIS — R519 Headache, unspecified: Secondary | ICD-10-CM

## 2016-03-16 DIAGNOSIS — R51 Headache: Secondary | ICD-10-CM | POA: Insufficient documentation

## 2016-03-16 DIAGNOSIS — R55 Syncope and collapse: Secondary | ICD-10-CM | POA: Insufficient documentation

## 2016-03-16 DIAGNOSIS — J45909 Unspecified asthma, uncomplicated: Secondary | ICD-10-CM | POA: Insufficient documentation

## 2016-03-16 LAB — TROPONIN I
Troponin I: <0.03 ng/mL (ref <0.03)
Troponin I: <0.03 ng/mL (ref <0.03)

## 2016-03-16 LAB — URINALYSIS COMPLETE WITH MICROSCOPIC (ARMC ONLY)
BILIRUBIN URINE: NEGATIVE
CELLULAR CAST UA: 3
Glucose, UA: NEGATIVE mg/dL
Hgb urine dipstick: NEGATIVE
Ketones, ur: NEGATIVE mg/dL
NITRITE: NEGATIVE
PH: 5 (ref 5.0–8.0)
Protein, ur: 100 mg/dL — AB
Specific Gravity, Urine: 1.02 (ref 1.005–1.030)

## 2016-03-16 LAB — CBC
HEMATOCRIT: 37.3 % (ref 35.0–47.0)
Hemoglobin: 12.7 g/dL (ref 12.0–16.0)
MCH: 32.7 pg (ref 26.0–34.0)
MCHC: 34 g/dL (ref 32.0–36.0)
MCV: 96.3 fL (ref 80.0–100.0)
Platelets: 219 10*3/uL (ref 150–440)
RBC: 3.87 MIL/uL (ref 3.80–5.20)
RDW: 14.5 % (ref 11.5–14.5)
WBC: 8.6 10*3/uL (ref 3.6–11.0)

## 2016-03-16 LAB — BASIC METABOLIC PANEL
Anion gap: 6 (ref 5–15)
BUN: 16 mg/dL (ref 6–20)
CALCIUM: 9.2 mg/dL (ref 8.9–10.3)
CO2: 25 mmol/L (ref 22–32)
Chloride: 107 mmol/L (ref 101–111)
Creatinine, Ser: 0.86 mg/dL (ref 0.44–1.00)
GFR calc Af Amer: >60 mL/min (ref >60)
GLUCOSE: 90 mg/dL (ref 65–99)
Potassium: 3.6 mmol/L (ref 3.5–5.1)
Sodium: 138 mmol/L (ref 135–145)

## 2016-03-16 LAB — PREGNANCY, URINE: Preg Test, Ur: NEGATIVE

## 2016-03-16 MED ORDER — NITROFURANTOIN MONOHYD MACRO 100 MG PO CAPS: 100.0000 mg | ORAL_CAPSULE | Freq: Two times a day (BID) | ORAL | 0 refills | Status: AC

## 2016-03-16 MED ORDER — METOCLOPRAMIDE HCL 5 MG/ML IJ SOLN
10.0000 mg | Freq: Once | INTRAMUSCULAR | Status: AC
  Administered 2016-03-16: 10 mg via INTRAVENOUS
  Filled 2016-03-16: qty 2

## 2016-03-16 MED ORDER — KETOROLAC TROMETHAMINE 30 MG/ML IJ SOLN
30.0000 mg | Freq: Once | INTRAMUSCULAR | Status: AC
  Administered 2016-03-16: 30 mg via INTRAVENOUS
  Filled 2016-03-16: qty 1

## 2016-03-16 MED ORDER — SODIUM CHLORIDE 0.9 % IV BOLUS (SEPSIS)
1000.0000 mL | Freq: Once | INTRAVENOUS | Status: AC
  Administered 2016-03-16: 1000 mL via INTRAVENOUS

## 2016-03-16 MED ORDER — NITROFURANTOIN MONOHYD MACRO 100 MG PO CAPS
100.0000 mg | ORAL_CAPSULE | Freq: Once | ORAL | Status: AC
  Administered 2016-03-16: 100 mg via ORAL
  Filled 2016-03-16: qty 1

## 2016-03-16 MED ORDER — KETOROLAC TROMETHAMINE 30 MG/ML IJ SOLN: 30.0000 mg | Freq: Once | INTRAMUSCULAR | Status: DC
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
## 2016-03-16 NOTE — ED Notes (Addendum)
Pt frustrated and wants to leave. Troponin drawn and sent to lab and Toradol given. Pt advised that she is free to leave at any time, but that we want to make sure we are turning every stone to ensure her safety. Family member is encouraging pt to stay.

## 2016-03-16 NOTE — Discharge Instructions (Signed)
You have been seen today in the Emergency Department (ED)  for syncope (passing out).  Your workup including labs and EKG did not show a cause for your syncope and were overall reassuring.   ° °Your symptoms may be due to dehydration, so it is important that you drink plenty of non-alcoholic fluids. Emotional stress, pain, or overheating--especially if you have been standing--can make you faint. In these cases, fainting is usually not serious. But fainting can be a sign of a more serious problem. Therefore it is imperative that you follow up with your doctor in 1-2 days for further evaluation. ° °When should you call for help?  °Call 911 anytime you think you may need emergency care. For example, call if:  °You have symptoms of a heart problem. These may include:  °Chest pain or pressure.  °Severe trouble breathing.  °A fast or irregular heartbeat.  °Lightheadedness or sudden weakness.  °Coughing up pink, foamy mucus.  °Passing out. °You have symptoms of a stroke. These may include:  °Sudden numbness, tingling, weakness, or loss of movement in your face, arm, or leg, especially on only one side of your body.  °Sudden vision changes.  °Sudden trouble speaking.  °Sudden confusion or trouble understanding simple statements.  °Sudden problems with walking or balance.  °A sudden, severe headache that is different from past headaches. °You passed out (lost consciousness) again. ° °Watch closely for changes in your health, and be sure to contact your doctor if:  °You do not get better as expected. ° ° How can you care for yourself at home?  °Drink plenty of fluids to prevent dehydration. If you have kidney, heart, or liver disease and have to limit fluids, talk with your doctor before you increase your fluid intake. ° ° °
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
## 2016-03-19 LAB — URINE CULTURE: Culture: >=100000 — AB

## 2016-03-20 ENCOUNTER — Telehealth: Payer: Self-pay | Admitting: Pharmacist

## 2016-03-20 NOTE — Telephone Encounter (Signed)
Called patient to injuire how she was doing. UCx results showed intermediate to macrobid. Pt states that she is feeling alright. Got a little light headed today at work. States that she does still have increased frequency of urination, but no burning. Since pt does have 1 symptom and abx prescribed at dc was intermediate, will change abx to bactrim. Explained to pt why abx was being changed. Told her to stop taking the macrobid and to start the bactrim. 1 tab po BID x 3 days. Bactrim DS BID x 3 days called into [facility] on [address] in [place]. RX authorized by Dr Scotty CourtStafford.  Toni FlossMelissa D Taleya Armstrong, Pharm.D Clinical Pharmacist

## 2016-05-10 ENCOUNTER — Emergency Department
Admission: EM | Admit: 2016-05-10 | Discharge: 2016-05-10 | Disposition: A | Discharge status: Home / Self Care | Payer: PRIVATE HEALTH INSURANCE | Attending: Emergency Medicine | Admitting: Emergency Medicine

## 2016-05-10 ENCOUNTER — Encounter: Payer: Self-pay | Admitting: *Deleted

## 2016-05-10 DIAGNOSIS — M25532 Pain in left wrist: Secondary | ICD-10-CM | POA: Diagnosis present

## 2016-05-10 DIAGNOSIS — J45909 Unspecified asthma, uncomplicated: Secondary | ICD-10-CM | POA: Insufficient documentation

## 2016-05-10 DIAGNOSIS — F17210 Nicotine dependence, cigarettes, uncomplicated: Secondary | ICD-10-CM | POA: Insufficient documentation

## 2016-05-10 DIAGNOSIS — M654 Radial styloid tenosynovitis [de Quervain]: Secondary | ICD-10-CM | POA: Diagnosis not present

## 2016-05-10 MED ORDER — NAPROXEN 500 MG PO TABS: 500.0000 mg | ORAL_TABLET | Freq: Two times a day (BID) | ORAL | 2 refills | Status: DC

## 2016-05-10 NOTE — ED Notes (Signed)
Pt verbalized understanding of d/c instructions, medications and follow up 

## 2016-05-10 NOTE — ED Provider Notes (Signed)
[facility] Emergency Department Provider Note   ____________________________________________    I have reviewed the triage vital signs and the nursing notes.   HISTORY  Chief Complaint Wrist Pain     HPI Toni Armstrong is a 36 y.o. female who presents with complaints of left wrist pain. The pain is aching in nature and worse with movement of her left thumb. She denies injury to the area. No falls. She has never had this before.   Past Medical History:  Diagnosis Date  . Asthma     There are no active problems to display for this patient.   History reviewed. No pertinent surgical history.  Prior to Admission medications   Medication Sig Start Date End Date Taking? Authorizing Provider  cyclobenzaprine (FLEXERIL) 10 MG tablet Take 10 mg by mouth 3 (three) times daily as needed for muscle spasms.    Historical Provider, MD  HYDROcodone-acetaminophen (NORCO/VICODIN) 5-325 MG tablet Take 1 tablet by mouth every 6 (six) hours as needed for moderate pain. 02/29/16   Sharyn CreamerMark Quale, MD  meloxicam (MOBIC) 15 MG tablet Take 15 mg by mouth daily.    Historical Provider, MD  naproxen (NAPROSYN) 500 MG tablet Take 1 tablet (500 mg total) by mouth 2 (two) times daily with a meal. 05/10/16   Jene Everyobert Nekita Pita, MD     Allergies Patient has no known allergies.  History reviewed. No pertinent family history.  Social History Social History  Substance Use Topics  . Smoking status: Current Every Day Smoker    Packs/day: 1.00    Types: Cigarettes  . Smokeless tobacco: Never Used  . Alcohol use Yes    Review of Systems  Constitutional: No fever/chills        Musculoskeletal: As above Skin: Negative for rash. Neurological: Negative for numbness    ____________________________________________   PHYSICAL EXAM:  VITAL SIGNS: ED Triage Vitals [05/10/16 1318]  Enc Vitals Group     BP 134/77     Pulse Rate 77     Resp 18     Temp 98.4 F (36.9 C)       Temp Source Oral     SpO2 100 %     Weight 200 lb (90.7 kg)     Height 5\' 6"  (1.676 m)     Head Circumference      Peak Flow      Pain Score 7     Pain Loc      Pain Edu?      Excl. in GC?     Constitutional: Alert and oriented. No acute distress. Pleasant and interactive Eyes: Conjunctivae are normal.   Nose: No congestion/rhinnorhea.    Cardiovascular: Normal rate, regular rhythm.  Respiratory: Normal respiratory effort.  No retractions. Genitourinary: deferred Musculoskeletal: Patient with tenderness to palpation in the left "anatomic snuffbox ", pain is worse with movement of left thumb. Consistent with tendinitis  Neurologic:  Normal speech and language. No gross focal neurologic deficits are appreciated.   Skin:  Skin is warm, dry and intact. No rash noted.   ____________________________________________   LABS (all labs ordered are listed, but only abnormal results are displayed)  Labs Reviewed - No data to display ____________________________________________  EKG   ____________________________________________  RADIOLOGY  None ____________________________________________   PROCEDURES  Procedure(s) performed: yes  SPLINT APPLICATION Date/Time: 10:05 PM Authorized by: Jene EveryKINNER, Lynesha Bango Consent: Verbal consent obtained. Risks and benefits: risks, benefits and alternatives were discussed Consent given by: patient Splint applied by:  me Location details: left wrist Splint type:thumb spica Supplies used: orthoglass Post-procedure: The splinted body part was neurovascularly unchanged following the procedure. Patient tolerance: Patient tolerated the procedure well with no immediate complications.       Critical Care performed: No ____________________________________________   INITIAL IMPRESSION / ASSESSMENT AND PLAN / ED COURSE  Pertinent labs & imaging results that were available during my care of the patient were reviewed by me and considered  in my medical decision making (see chart for details).  Splint applied to the left wrist, NSAIDs, ice recommended   ____________________________________________   FINAL CLINICAL IMPRESSION(S) / ED DIAGNOSES  Final diagnoses:  De Quervain's tenosynovitis, left      NEW MEDICATIONS STARTED DURING THIS VISIT:  Discharge Medication List as of 05/10/2016  2:49 PM    START taking these medications   Details  naproxen (NAPROSYN) 500 MG tablet Take 1 tablet (500 mg total) by mouth 2 (two) times daily with a meal., Starting Wed 05/10/2016, Print         Note:  This document was prepared using Dragon voice recognition software and may include unintentional dictation errors.    Jene Every, MD 05/10/16 2206

## 2016-05-10 NOTE — ED Triage Notes (Signed)
Pt states left wrist pain for 2 days, cap refill <3 secs,  sensation intact

## 2016-05-10 NOTE — ED Notes (Signed)
Pt reports left hand/wrist started hurting. Denies injuries. Skin warm and dry. Pt reports unsure of what caused the pain.

## 2016-07-13 ENCOUNTER — Ambulatory Visit
Admission: EM | Admit: 2016-07-13 | Discharge: 2016-07-13 | Disposition: A | Discharge status: Home / Self Care | Payer: PRIVATE HEALTH INSURANCE | Attending: Family Medicine | Admitting: Family Medicine

## 2016-07-13 ENCOUNTER — Encounter: Payer: Self-pay | Admitting: *Deleted

## 2016-07-13 DIAGNOSIS — R42 Dizziness and giddiness: Secondary | ICD-10-CM

## 2016-07-13 DIAGNOSIS — Z87820 Personal history of traumatic brain injury: Secondary | ICD-10-CM
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
## 2016-08-25 ENCOUNTER — Ambulatory Visit (INDEPENDENT_AMBULATORY_CARE_PROVIDER_SITE_OTHER): Payer: PRIVATE HEALTH INSURANCE

## 2016-08-25 ENCOUNTER — Encounter: Payer: Self-pay | Admitting: *Deleted

## 2016-08-25 ENCOUNTER — Ambulatory Visit
Admission: EM | Admit: 2016-08-25 | Discharge: 2016-08-25 | Disposition: A | Discharge status: Home / Self Care | Payer: PRIVATE HEALTH INSURANCE | Attending: Family Medicine | Admitting: Family Medicine

## 2016-08-25 DIAGNOSIS — S63502A Unspecified sprain of left wrist, initial encounter: Secondary | ICD-10-CM
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
## 2016-08-25 NOTE — Discharge Instructions (Signed)
Rest, ice, elevate arm/hand, over the counter Advil/Motrin and/or tylenol as needed for pain

## 2016-09-01 ENCOUNTER — Emergency Department
Admission: EM | Admit: 2016-09-01 | Discharge: 2016-09-01 | Disposition: A | Discharge status: Home / Self Care | Payer: PRIVATE HEALTH INSURANCE | Attending: Emergency Medicine | Admitting: Emergency Medicine

## 2016-09-01 ENCOUNTER — Encounter: Payer: Self-pay | Admitting: *Deleted

## 2016-09-01 DIAGNOSIS — Y939 Activity, unspecified: Secondary | ICD-10-CM | POA: Insufficient documentation

## 2016-09-01 DIAGNOSIS — W57XXXA Bitten or stung by nonvenomous insect and other nonvenomous arthropods, initial encounter: Secondary | ICD-10-CM | POA: Diagnosis not present

## 2016-09-01 DIAGNOSIS — J45909 Unspecified asthma, uncomplicated: Secondary | ICD-10-CM | POA: Diagnosis not present

## 2016-09-01 DIAGNOSIS — Y929 Unspecified place or not applicable: Secondary | ICD-10-CM | POA: Diagnosis not present

## 2016-09-01 DIAGNOSIS — Y999 Unspecified external cause status: Secondary | ICD-10-CM | POA: Diagnosis not present

## 2016-09-01 DIAGNOSIS — S90861A Insect bite (nonvenomous), right foot, initial encounter: Secondary | ICD-10-CM | POA: Insufficient documentation

## 2016-09-01 DIAGNOSIS — F17210 Nicotine dependence, cigarettes, uncomplicated: Secondary | ICD-10-CM | POA: Insufficient documentation

## 2016-09-01 MED ORDER — HYDROCORTISONE 2.5 % EX OINT: TOPICAL_OINTMENT | Freq: Two times a day (BID) | CUTANEOUS | 0 refills | Status: DC
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
## 2016-09-01 NOTE — ED Notes (Signed)
Pt to ed with c/o insect bite to second toe on right foot.  Pt with mild swelling noted to toe. NP at bedside.

## 2017-03-14 ENCOUNTER — Emergency Department: Payer: PRIVATE HEALTH INSURANCE

## 2017-03-14 ENCOUNTER — Emergency Department
Admission: EM | Admit: 2017-03-14 | Discharge: 2017-03-15 | Disposition: A | Discharge status: Home / Self Care | Payer: PRIVATE HEALTH INSURANCE | Attending: Emergency Medicine | Admitting: Emergency Medicine

## 2017-03-14 ENCOUNTER — Encounter: Payer: Self-pay | Admitting: Emergency Medicine

## 2017-03-14 DIAGNOSIS — X509XXA Other and unspecified overexertion or strenuous movements or postures, initial encounter: Secondary | ICD-10-CM | POA: Insufficient documentation

## 2017-03-14 DIAGNOSIS — Y9383 Activity, rough housing and horseplay: Secondary | ICD-10-CM | POA: Insufficient documentation

## 2017-03-14 DIAGNOSIS — Y998 Other external cause status: Secondary | ICD-10-CM | POA: Insufficient documentation

## 2017-03-14 DIAGNOSIS — F17210 Nicotine dependence, cigarettes, uncomplicated: Secondary | ICD-10-CM | POA: Insufficient documentation

## 2017-03-14 DIAGNOSIS — J45909 Unspecified asthma, uncomplicated: Secondary | ICD-10-CM | POA: Insufficient documentation

## 2017-03-14 DIAGNOSIS — S8011XA Contusion of right lower leg, initial encounter: Secondary | ICD-10-CM | POA: Insufficient documentation

## 2017-03-14 DIAGNOSIS — Z79899 Other long term (current) drug therapy: Secondary | ICD-10-CM | POA: Insufficient documentation

## 2017-03-14 DIAGNOSIS — Y929 Unspecified place or not applicable: Secondary | ICD-10-CM | POA: Insufficient documentation
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
## 2017-03-15 MED ORDER — TRAMADOL HCL 50 MG PO TABS
100.0000 mg | ORAL_TABLET | Freq: Once | ORAL | Status: AC
Start: 2017-03-15 — End: 2017-03-15
  Administered 2017-03-15: 100 mg via ORAL
  Filled 2017-03-15: qty 2

## 2017-03-15 MED ORDER — TRAMADOL HCL 50 MG PO TABS: 50.0000 mg | ORAL_TABLET | Freq: Four times a day (QID) | ORAL | 0 refills | Status: AC | PRN

## 2017-03-15 MED ORDER — LIDOCAINE 5 % EX PTCH
1.0000 | MEDICATED_PATCH | CUTANEOUS | Status: DC
  Administered 2017-03-15: 1 via TRANSDERMAL
  Filled 2017-03-15: qty 1
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
## 2019-03-17 ENCOUNTER — Other Ambulatory Visit: Payer: Self-pay

## 2019-03-17 ENCOUNTER — Ambulatory Visit
Admission: EM | Admit: 2019-03-17 | Discharge: 2019-03-17 | Disposition: A | Discharge status: Home / Self Care | Payer: 59 | Attending: Family Medicine | Admitting: Family Medicine

## 2019-03-17 DIAGNOSIS — F17210 Nicotine dependence, cigarettes, uncomplicated: Secondary | ICD-10-CM

## 2019-03-17 DIAGNOSIS — J4521 Mild intermittent asthma with (acute) exacerbation: Secondary | ICD-10-CM

## 2019-03-17 MED ORDER — PREDNISONE 10 MG PO TABS: ORAL_TABLET | ORAL | 0 refills | Status: DC

## 2019-03-17 NOTE — ED Provider Notes (Signed)
[facility]    CSN: [id number] Arrival date & time: 03/17/19  1926      History   Chief Complaint Chief Complaint  Patient presents with  . Cough    HPI Toni Armstrong is a 38 y.o. female.   38 yo female with a c/o "asthma attack" for the past 3 days. States she's been "in and out of the cold weather" and this has exacerbated her asthma with wheezing.  Has had to use her albuterol inhaler more frequently. Denies any fevers, chills, nasal congestion, runny nose, body aches, sick contacts.    Cough   Past Medical History:  Diagnosis Date  . Asthma     There are no active problems to display for this patient.   History reviewed. No pertinent surgical history.  OB History   No obstetric history on file.      Home Medications    Prior to Admission medications   Medication Sig Start Date End Date Taking? Authorizing Provider  albuterol (VENTOLIN HFA) 108 (90 Base) MCG/ACT inhaler Inhale into the lungs.    [provider]  cyclobenzaprine (FLEXERIL) 10 MG tablet Take 10 mg by mouth 3 (three) times daily as needed for muscle spasms.    [provider]  HYDROcodone-acetaminophen (NORCO/VICODIN) 5-325 MG tablet Take 1 tablet by mouth every 6 (six) hours as needed for moderate pain. 02/29/16   Delman Kitten, MD  hydrocortisone 2.5 % ointment Apply topically 2 (two) times daily. 09/01/16   Triplett, Cari B, FNP  meloxicam (MOBIC) 15 MG tablet Take 15 mg by mouth daily.    [provider]  naproxen (NAPROSYN) 500 MG tablet Take 1 tablet (500 mg total) by mouth 2 (two) times daily with a meal. 05/10/16   Lavonia Drafts, MD  predniSONE (DELTASONE) 10 MG tablet Start 60 mg po day one, then 50 mg po day two, taper by 10 mg daily until complete. 03/17/19   Norval Gable, MD    Family History History reviewed. No pertinent family history.  Social History Social History   Tobacco Use  . Smoking status: Current Every Day Smoker    Packs/day:  1.00    Types: Cigarettes  . Smokeless tobacco: Never Used  Substance Use Topics  . Alcohol use: Yes    Comment: social  . Drug use: No     Allergies   Patient has no known allergies.   Review of Systems Review of Systems  Respiratory: Positive for cough.      Physical Exam Triage Vital Signs ED Triage Vitals  Enc Vitals Group     BP 03/17/19 1935 (!) 131/92     Pulse Rate 03/17/19 1935 76     Resp 03/17/19 1935 20     Temp 03/17/19 1935 98.9 F (37.2 C)     Temp Source 03/17/19 1935 Oral     SpO2 03/17/19 1935 99 %     Weight 03/17/19 1937 215 lb (97.5 kg)     Height 03/17/19 1937 5\' 6"  (1.676 m)     Head Circumference --      Peak Flow --      Pain Score 03/17/19 1936 0     Pain Loc --      Pain Edu? --      Excl. in [place]? --    No data found.  Updated Vital Signs BP (!) 131/92 (BP Location: Left Arm)   Pulse 76   Temp 98.9 F (37.2 C) (Oral)  Resp 20   Ht 5\' 6"  (1.676 m)   Wt 97.5 kg   LMP 02/21/2019   SpO2 99%   BMI 34.70 kg/m   Visual Acuity Right Eye Distance:   Left Eye Distance:   Bilateral Distance:    Right Eye Near:   Left Eye Near:    Bilateral Near:     Physical Exam Vitals signs and nursing note reviewed.  Constitutional:      General: She is not in acute distress.    Appearance: She is not toxic-appearing or diaphoretic.  Cardiovascular:     Rate and Rhythm: Normal rate and regular rhythm.  Pulmonary:     Effort: Pulmonary effort is normal. No respiratory distress.     Breath sounds: No stridor. Wheezing (mild, diffuse) present. No rhonchi or rales.  Neurological:     Mental Status: She is alert.      UC Treatments / Results  Labs (all labs ordered are listed, but only abnormal results are displayed) Labs Reviewed - No data to display  EKG   Radiology No results found.  Procedures Procedures (including critical care time)  Medications Ordered in UC Medications - No data to display  Initial Impression /  Assessment and Plan / UC Course  I have reviewed the triage vital signs and the nursing notes.  Pertinent labs & imaging results that were available during my care of the patient were reviewed by me and considered in my medical decision making (see chart for details).      Final Clinical Impressions(s) / UC Diagnoses   Final diagnoses:  Mild intermittent asthma with exacerbation     Discharge Instructions     Continue albuterol inhaler    ED Prescriptions    Medication Sig Dispense Auth. Provider   predniSONE (DELTASONE) 10 MG tablet Start 60 mg po day one, then 50 mg po day two, taper by 10 mg daily until complete. 21 tablet 02/23/2019, MD      1. diagnosis reviewed with patient 2. rx as per orders above; reviewed possible side effects, interactions, risks and benefits  3. Recommend supportive treatment as above 4. Follow-up prn if symptoms worsen or don't improve  PDMP not reviewed this encounter.   [name], MD 03/17/19 2032

## 2019-03-17 NOTE — Discharge Instructions (Signed)
Continue albuterol inhaler.

## 2019-03-17 NOTE — ED Triage Notes (Signed)
Pt with "asthma attack" 3 days ago and has residual cough that she states usually takes a steroid to resolve. Also states she sort of feels like the last time she had bronchitis. No fever or other flu-like sx

## 2019-05-22 ENCOUNTER — Emergency Department
Admission: EM | Admit: 2019-05-22 | Discharge: 2019-05-22 | Disposition: A | Discharge status: Home / Self Care | Payer: 59 | Attending: Emergency Medicine | Admitting: Emergency Medicine

## 2019-05-22 ENCOUNTER — Other Ambulatory Visit: Payer: Self-pay

## 2019-05-22 ENCOUNTER — Emergency Department: Payer: 59

## 2019-05-22 DIAGNOSIS — M791 Myalgia, unspecified site: Secondary | ICD-10-CM | POA: Insufficient documentation

## 2019-05-22 DIAGNOSIS — Z20822 Contact with and (suspected) exposure to covid-19: Secondary | ICD-10-CM | POA: Diagnosis not present

## 2019-05-22 DIAGNOSIS — R05 Cough: Secondary | ICD-10-CM | POA: Diagnosis not present

## 2019-05-22 DIAGNOSIS — R059 Cough, unspecified: Secondary | ICD-10-CM

## 2019-05-22 DIAGNOSIS — J45909 Unspecified asthma, uncomplicated: Secondary | ICD-10-CM | POA: Insufficient documentation

## 2019-05-22 DIAGNOSIS — R6883 Chills (without fever): Secondary | ICD-10-CM | POA: Insufficient documentation

## 2019-05-22 DIAGNOSIS — F17210 Nicotine dependence, cigarettes, uncomplicated: Secondary | ICD-10-CM | POA: Insufficient documentation

## 2019-05-22 DIAGNOSIS — R0602 Shortness of breath: Secondary | ICD-10-CM | POA: Insufficient documentation

## 2019-05-22 MED ORDER — AZITHROMYCIN 500 MG PO TABS
500.0000 mg | ORAL_TABLET | Freq: Once | ORAL | Status: AC
  Administered 2019-05-22: 500 mg via ORAL
  Filled 2019-05-22: qty 1

## 2019-05-22 MED ORDER — AZITHROMYCIN 250 MG PO TABS: ORAL_TABLET | ORAL | 0 refills | Status: DC

## 2019-05-22 MED ORDER — PREDNISONE 10 MG (21) PO TBPK: ORAL_TABLET | ORAL | 0 refills | Status: DC

## 2019-05-22 NOTE — ED Triage Notes (Signed)
Pt in with co cough and body aches since this am. Denies any, states gets shob when she talks a lot. Pt does smoke and has a hx of bronchitis. PT also co headache, was tested 1/19 and had negative results.

## 2019-05-22 NOTE — Discharge Instructions (Signed)
Follow-up with your regular doctor if not improving in 3 days.  Return emergency department worsening.  Take medication as prescribed. Stay quarantined for 10 days.  Your test result will be ready in 6-24 hours

## 2019-05-22 NOTE — ED Provider Notes (Signed)
[facility] Emergency Department Provider Note  ____________________________________________   First MD Initiated Contact with Patient 05/22/19 2302     (approximate)  I have reviewed the triage vital signs and the nursing notes.   HISTORY  Chief Complaint Cough    HPI Toni Armstrong is a 39 y.o. female presents to emergency department complaining of cough and body aches.  No known fever but has had chills.  Some shortness of breath when she talks.  Patient was exposed to Covid by a coworker.  She tested negative near the day of exposure.  However since then she has become symptomatic.  She denies any vomiting or diarrhea.  No cardiac type chest pain.    Past Medical History:  Diagnosis Date  . Asthma     There are no problems to display for this patient.   No past surgical history on file.  Prior to Admission medications   Medication Sig Start Date End Date Taking? Authorizing Provider  albuterol (VENTOLIN HFA) 108 (90 Base) MCG/ACT inhaler Inhale into the lungs.    [provider]  azithromycin (ZITHROMAX Z-PAK) 250 MG tablet 2 pills today then 1 pill a day for 4 days 05/22/19   Sherrie Mustache Roselyn Bering, PA-C  cyclobenzaprine (FLEXERIL) 10 MG tablet Take 10 mg by mouth 3 (three) times daily as needed for muscle spasms.    [provider]  meloxicam (MOBIC) 15 MG tablet Take 15 mg by mouth daily.    [provider]  predniSONE (STERAPRED UNI-PAK 21 TAB) 10 MG (21) TBPK tablet Take 6 pills on day one then decrease by 1 pill each day 05/22/19   Faythe Ghee, PA-C    Allergies Patient has no known allergies.  No family history on file.  Social History Social History   Tobacco Use  . Smoking status: Current Every Day Smoker    Packs/day: 1.00    Types: Cigarettes  . Smokeless tobacco: Never Used  Substance Use Topics  . Alcohol use: Yes    Comment: social  . Drug use: No    Review of Systems  Constitutional: Positive  /chills Eyes: No visual changes. ENT: No sore throat. Respiratory: Positive cough Cardiovascular: Denies chest pain Gastrointestinal: Denies abdominal pain Genitourinary: Negative for dysuria. Musculoskeletal: Negative for back pain. Skin: Negative for rash. Psychiatric: no mood changes,     ____________________________________________   PHYSICAL EXAM:  VITAL SIGNS: ED Triage Vitals [05/22/19 2214]  Enc Vitals Group     BP 126/72     Pulse Rate (!) 102     Resp 20     Temp 99.6 F (37.6 C)     Temp Source Oral     SpO2 98 %     Weight 216 lb (98 kg)     Height 5\' 6"  (1.676 m)     Head Circumference      Peak Flow      Pain Score 7     Pain Loc      Pain Edu?      Excl. in GC?     Constitutional: Alert and oriented. Well appearing and in no acute distress. Eyes: Conjunctivae are normal.  Head: Atraumatic. Nose: No congestion/rhinnorhea. Mouth/Throat: Mucous membranes are moist.   Neck:  supple no lymphadenopathy noted Cardiovascular: Normal rate, regular rhythm. Heart sounds are normal Respiratory: Normal respiratory effort.  No retractions, lungs c t a  GU: deferred Musculoskeletal: FROM all extremities, warm and well perfused Neurologic:  Normal speech  and language.  Skin:  Skin is warm, dry and intact. No rash noted. Psychiatric: Mood and affect are normal. Speech and behavior are normal.  ____________________________________________   LABS (all labs ordered are listed, but only abnormal results are displayed)  Labs Reviewed  SARS CORONAVIRUS 2 (TAT 6-24 HRS)   ____________________________________________   ____________________________________________  RADIOLOGY  Chest x-ray shows interstitial inflammation, no Covid opacities are noted at this time  ____________________________________________   PROCEDURES  Procedure(s) performed: Zithromax 500 mg p.o.   Procedures    ____________________________________________   INITIAL IMPRESSION  / ASSESSMENT AND PLAN / ED COURSE  Pertinent labs & imaging results that were available during my care of the patient were reviewed by me and considered in my medical decision making (see chart for details).   The patient is a 39 year old female presents emergency department Covid symptoms.  See HPI  Physical exam shows patient to appear well.  Vitals are normal.  Lungs are clear to auscultation.  Chest x-ray shows interstitial thickening  I explained the findings to the patient.  Explained that since she had a Covid exposure and is starting to have difficulty breathing along with interstitial thickening I feel that she is at the beginning stages of Covid.  We will repeat her Covid test tonight.  She was given a Z-Pak and steroid pack.  She is to return emergency department worsening.  Quarantine for 10 days.  Work note was given to her explaining the same.  She was discharged in stable condition.    Toni Armstrong was evaluated in Emergency Department on 05/22/2019 for the symptoms described in the history of present illness. She was evaluated in the context of the global COVID-19 pandemic, which necessitated consideration that the patient might be at risk for infection with the SARS-CoV-2 virus that causes COVID-19. Institutional protocols and algorithms that pertain to the evaluation of patients at risk for COVID-19 are in a state of rapid change based on information released by regulatory bodies including the CDC and federal and state organizations. These policies and algorithms were followed during the patient's care in the ED.   As part of my medical decision making, I reviewed the following data within the [facility] notes reviewed and incorporated, Old chart reviewed, Radiograph reviewed see above, Notes from prior ED visits and [place] Controlled Substance Database  ____________________________________________   FINAL CLINICAL IMPRESSION(S) / ED DIAGNOSES  Final  diagnoses:  Suspected COVID-19 virus infection      NEW MEDICATIONS STARTED DURING THIS VISIT:  New Prescriptions   AZITHROMYCIN (ZITHROMAX Z-PAK) 250 MG TABLET    2 pills today then 1 pill a day for 4 days   PREDNISONE (STERAPRED UNI-PAK 21 TAB) 10 MG (21) TBPK TABLET    Take 6 pills on day one then decrease by 1 pill each day     Note:  This document was prepared using Dragon voice recognition software and may include unintentional dictation errors.    Versie Starks, PA-C 05/22/19 2341    Harvest Dark, MD 05/29/19 1925

## 2019-05-23 ENCOUNTER — Telehealth: Payer: Self-pay | Admitting: General Practice

## 2019-05-23 LAB — SARS CORONAVIRUS 2 (TAT 6-24 HRS): SARS Coronavirus 2: NEGATIVE

## 2019-05-23 NOTE — Telephone Encounter (Signed)
Returned call to pt.  She is requesting a note to release her to return to work, since her COVID test was negative. Reviewed pt's ER note of 05/22/19, and noted that the provider advised that, due to recent COVID exposure, chest xray showing "interstitial thickening", and shortness of breath, she felt the pt. was 'at the beginning stages at COVID'.   Advised pt. that the ER Provider has written a note, keeping her out of work, until 06/02/19.  The pt. verb. frustration that she has had 2 negative COVID tests recently, and feels she should be okay to return to work.  Advised pt. 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  The pt. Stated she doesn't think she has COVID.  Stated she thinks she has bronchitis.  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  The pt. stated "thank you for returning my call."  She disconnected the call.

## 2019-05-23 NOTE — Telephone Encounter (Signed)
Pt called requesting a document that states that she is able to return to work considering her recent negative test results for the virus. Advised that this typically comes from a PCP (which she does not have) However patient insisted that [facility] assist in some way. Please advise   Document must state: "needs something stating that she can return to work because the doctor she saw yesterday stated that she needs to quarantine until 06/02/2019"   Document may be uploaded to [employer] contact: [phone]

## 2019-09-25 DIAGNOSIS — M79604 Pain in right leg: Secondary | ICD-10-CM | POA: Insufficient documentation

## 2019-09-26 DIAGNOSIS — E559 Vitamin D deficiency, unspecified: Secondary | ICD-10-CM | POA: Insufficient documentation

## 2019-09-26 DIAGNOSIS — E538 Deficiency of other specified B group vitamins: Secondary | ICD-10-CM | POA: Insufficient documentation

## 2019-10-15 ENCOUNTER — Other Ambulatory Visit: Payer: Self-pay | Admitting: Physician Assistant

## 2019-10-15 DIAGNOSIS — M25559 Pain in unspecified hip: Secondary | ICD-10-CM

## 2019-10-15 DIAGNOSIS — M79604 Pain in right leg: Secondary | ICD-10-CM

## 2019-10-24 ENCOUNTER — Ambulatory Visit
Admission: EM | Admit: 2019-10-24 | Discharge: 2019-10-24 | Disposition: A | Discharge status: Home / Self Care | Payer: 59 | Attending: Family Medicine | Admitting: Family Medicine

## 2019-10-24 ENCOUNTER — Ambulatory Visit (INDEPENDENT_AMBULATORY_CARE_PROVIDER_SITE_OTHER): Payer: 59

## 2019-10-24 ENCOUNTER — Other Ambulatory Visit: Payer: Self-pay

## 2019-10-24 DIAGNOSIS — G44319 Acute post-traumatic headache, not intractable: Secondary | ICD-10-CM

## 2019-10-24 MED ORDER — KETOROLAC TROMETHAMINE 10 MG PO TABS: 10.0000 mg | ORAL_TABLET | Freq: Four times a day (QID) | ORAL | 0 refills | Status: DC | PRN

## 2019-10-24 NOTE — Discharge Instructions (Signed)
Rest.  Medication as needed.  Take care  Dr. Weiland Tomich  
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
## 2019-10-28 ENCOUNTER — Ambulatory Visit: Payer: 59

## 2020-06-02 ENCOUNTER — Other Ambulatory Visit: Payer: Self-pay

## 2020-06-02 ENCOUNTER — Ambulatory Visit
Admission: EM | Admit: 2020-06-02 | Discharge: 2020-06-02 | Disposition: A | Discharge status: Home / Self Care | Payer: 59 | Attending: Sports Medicine | Admitting: Sports Medicine

## 2020-06-02 DIAGNOSIS — M7521 Bicipital tendinitis, right shoulder: Secondary | ICD-10-CM

## 2020-06-02 DIAGNOSIS — S46911A Strain of unspecified muscle, fascia and tendon at shoulder and upper arm level, right arm, initial encounter: Secondary | ICD-10-CM | POA: Diagnosis not present

## 2020-06-02 MED ORDER — MELOXICAM 15 MG PO TABS: 15.0000 mg | ORAL_TABLET | Freq: Every day | ORAL | 0 refills | Status: DC

## 2020-06-02 MED ORDER — CYCLOBENZAPRINE HCL 10 MG PO TABS: 10.0000 mg | ORAL_TABLET | Freq: Two times a day (BID) | ORAL | 0 refills | Status: DC | PRN
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
## 2020-06-02 NOTE — Discharge Instructions (Addendum)
You have been diagnosed with biceps tendinitis.  You also have a strain of your right shoulder from lifting the cones today.  Schedule appointment with orthopedics and if you get better he can always cancel.  I prescribed an anti-inflammatory and a muscle relaxer that he can use as needed.  Educational handouts provided.  Work note provided.  Follow-up here as needed.  I hope you get to feeling better, Dr. Zachery Dauer

## 2020-06-02 NOTE — ED Triage Notes (Signed)
Patient complains of right shoulder pain that has been on going for 2-3 weeks. States that today after lifting cones at work she felt a pain in her shoulder and it has been constant since.

## 2020-08-02 ENCOUNTER — Other Ambulatory Visit: Payer: Self-pay

## 2020-08-02 ENCOUNTER — Emergency Department
Admission: EM | Admit: 2020-08-02 | Discharge: 2020-08-02 | Disposition: A | Discharge status: Home / Self Care | Payer: 59 | Attending: Emergency Medicine | Admitting: Emergency Medicine

## 2020-08-02 DIAGNOSIS — F17210 Nicotine dependence, cigarettes, uncomplicated: Secondary | ICD-10-CM | POA: Insufficient documentation

## 2020-08-02 DIAGNOSIS — J45909 Unspecified asthma, uncomplicated: Secondary | ICD-10-CM | POA: Insufficient documentation

## 2020-08-02 DIAGNOSIS — L089 Local infection of the skin and subcutaneous tissue, unspecified: Secondary | ICD-10-CM

## 2020-08-02 DIAGNOSIS — L723 Sebaceous cyst: Secondary | ICD-10-CM | POA: Insufficient documentation

## 2020-08-02 DIAGNOSIS — Z79899 Other long term (current) drug therapy: Secondary | ICD-10-CM | POA: Diagnosis not present

## 2020-08-02 MED ORDER — LIDOCAINE-EPINEPHRINE-TETRACAINE (LET) TOPICAL GEL
3.0000 mL | Freq: Once | TOPICAL | Status: AC
  Administered 2020-08-02: 3 mL via TOPICAL

## 2020-08-02 MED ORDER — SULFAMETHOXAZOLE-TRIMETHOPRIM 800-160 MG PO TABS: 1.0000 | ORAL_TABLET | Freq: Two times a day (BID) | ORAL | 0 refills | Status: AC

## 2020-08-02 NOTE — Discharge Instructions (Signed)
Take Bactrim twice daily for seven days.  

## 2020-08-02 NOTE — ED Triage Notes (Signed)
Pt in with co small abscess behind right ear x 1 week, did drain some today.

## 2020-08-02 NOTE — ED Notes (Signed)
Pt with pea sized lump behind right ear x 7 days. Pt states it is oozing blood. Pt with birthmark in right ear, pt states that has remained the same. Pt denies fevers.

## 2020-08-02 NOTE — ED Provider Notes (Signed)
[facility]-EMERGENCY DEPARTMENT  ____________________________________________  Time seen: Approximately 8:49 PM  I have reviewed the triage vital signs and the nursing notes.   HISTORY  Chief Complaint Abscess   Historian Patient     HPI Toni Armstrong is a 40 y.o. female presents to the emergency department with an infected sebaceous cyst behind the right ear for the past 7 days.  Patient states that she had some blood tinged drainage today.  Patient states that she has had 2 prior abscesses and is requesting incision and drainage.  No fever or chills.   Past Medical History:  Diagnosis Date  . Asthma      Immunizations up to date:  Yes.     Past Medical History:  Diagnosis Date  . Asthma     There are no problems to display for this patient.   No past surgical history on file.  Prior to Admission medications   Medication Sig Start Date End Date Taking? Authorizing Provider  sulfamethoxazole-trimethoprim (BACTRIM DS) 800-160 MG tablet Take 1 tablet by mouth 2 (two) times daily for 7 days. 08/02/20 08/09/20 Yes Pia Mau M, PA-C  albuterol (VENTOLIN HFA) 108 (90 Base) MCG/ACT inhaler Inhale into the lungs.    [provider]  cyclobenzaprine (FLEXERIL) 10 MG tablet Take 1 tablet (10 mg total) by mouth 2 (two) times daily as needed for muscle spasms. 06/02/20   Delton See, MD  ketorolac (TORADOL) 10 MG tablet Take 1 tablet (10 mg total) by mouth every 6 (six) hours as needed for moderate pain or severe pain. 10/24/19   Tommie Sams, DO  meloxicam (MOBIC) 15 MG tablet Take 1 tablet (15 mg total) by mouth daily. 06/02/20   Delton See, MD  varenicline (CHANTIX) 1 MG tablet Take 1 mg by mouth 2 (two) times daily.    [provider]    Allergies Patient has no known allergies.  Family History  Problem Relation Age of Onset  . Hypertension Mother   . Cancer Father     Social History Social History   Tobacco Use  . Smoking status: Current  Every Day Smoker    Packs/day: 0.25    Types: Cigarettes  . Smokeless tobacco: Never Used  Vaping Use  . Vaping Use: Never used  Substance Use Topics  . Alcohol use: Not Currently    Comment: social  . Drug use: No     Review of Systems  Constitutional: No fever/chills Eyes:  No discharge ENT: No upper respiratory complaints. Respiratory: no cough. No SOB/ use of accessory muscles to breath Gastrointestinal:   No nausea, no vomiting.  No diarrhea.  No constipation. Musculoskeletal: Negative for musculoskeletal pain. Skin: Patient has infected cyst.    ____________________________________________   PHYSICAL EXAM:  VITAL SIGNS: ED Triage Vitals  Enc Vitals Group     BP 08/02/20 1931 (!) 142/88     Pulse Rate 08/02/20 1930 77     Resp 08/02/20 1930 20     Temp 08/02/20 1930 98.8 F (37.1 C)     Temp Source 08/02/20 1930 Oral     SpO2 08/02/20 1931 100 %     Weight 08/02/20 1931 226 lb (102.5 kg)     Height 08/02/20 1931 5\' 6"  (1.676 m)     Head Circumference --      Peak Flow --      Pain Score 08/02/20 1931 6     Pain Loc --      Pain Edu? --  Excl. in GC? --      Constitutional: Alert and oriented. Well appearing and in no acute distress. Eyes: Conjunctivae are normal. PERRL. EOMI. Head: Atraumatic. ENT:      Nose: No congestion/rhinnorhea.      Mouth/Throat: Mucous membranes are moist.  Neck: No stridor.  No cervical spine tenderness to palpation. Cardiovascular: Normal rate, regular rhythm. Normal S1 and S2.  Good peripheral circulation. Respiratory: Normal respiratory effort without tachypnea or retractions. Lungs CTAB. Good air entry to the bases with no decreased or absent breath sounds Gastrointestinal: Bowel sounds x 4 quadrants. Soft and nontender to palpation. No guarding or rigidity. No distention. Musculoskeletal: Full range of motion to all extremities. No obvious deformities noted Neurologic:  Normal for age. No gross focal neurologic  deficits are appreciated.  Skin: Patient has a 1 cm x 1 cm infected sebaceous cyst behind right ear.  No surrounding cellulitis. Psychiatric: Mood and affect are normal for age. Speech and behavior are normal.   ____________________________________________   LABS (all labs ordered are listed, but only abnormal results are displayed)  Labs Reviewed - No data to display ____________________________________________  EKG   ____________________________________________  RADIOLOGY   No results found.  ____________________________________________    PROCEDURES  Procedure(s) performed:     Marland KitchenMarland KitchenIncision and Drainage  Date/Time: 08/02/2020 9:38 PM Performed by: Orvil Feil, PA-C Authorized by: Orvil Feil, PA-C   Consent:    Consent obtained:  Verbal   Consent given by:  Patient   Risks discussed:  Bleeding, incomplete drainage, pain and damage to other organs   Alternatives discussed:  No treatment Universal protocol:    Procedure explained and questions answered to patient or proxy's satisfaction: yes     Relevant documents present and verified: yes     Test results available : yes     Imaging studies available: yes     Required blood products, implants, devices, and special equipment available: yes     Site/side marked: yes     Immediately prior to procedure, a time out was called: yes     Patient identity confirmed:  Verbally with patient Location:    Type:  Abscess Pre-procedure details:    Skin preparation:  Betadine Anesthesia:    Anesthesia method:  Local infiltration   Local anesthetic:  Lidocaine 1% WITH epi Procedure type:    Complexity:  Complex Procedure details:    Incision types:  Single straight   Incision depth:  Subcutaneous   Wound management:  Probed and deloculated, irrigated with saline and extensive cleaning   Drainage:  Purulent   Drainage amount:  Moderate   Wound treatment:  Wound left open Post-procedure details:    Procedure  completion:  Tolerated well, no immediate complications       Medications  lidocaine-EPINEPHrine-tetracaine (LET) topical gel (3 mLs Topical Given by Other 08/02/20 2051)     ____________________________________________   INITIAL IMPRESSION / ASSESSMENT AND PLAN / ED COURSE  Pertinent labs & imaging results that were available during my care of the patient were reviewed by me and considered in my medical decision making (see chart for details).      Assessment and Plan: Infected cyst:  40 year old female presents to the emergency department with an infected sebaceous cyst behind right ear.  Patient underwent incision and drainage without complication.  She was discharged with Bactrim.  Return precautions were given to return with new or worsening symptoms.    ____________________________________________  FINAL CLINICAL IMPRESSION(S) / ED  DIAGNOSES  Final diagnoses:  Infected sebaceous cyst      NEW MEDICATIONS STARTED DURING THIS VISIT:  ED Discharge Orders         Ordered    sulfamethoxazole-trimethoprim (BACTRIM DS) 800-160 MG tablet  2 times daily        08/02/20 2133              This chart was dictated using voice recognition software/Dragon. Despite best efforts to proofread, errors can occur which can change the meaning. Any change was purely unintentional.     Gasper Lloyd 08/02/20 2139    Minna Antis, MD 08/02/20 2356

## 2020-09-28 DIAGNOSIS — N926 Irregular menstruation, unspecified: Secondary | ICD-10-CM | POA: Insufficient documentation

## 2021-04-29 ENCOUNTER — Other Ambulatory Visit: Payer: Self-pay | Admitting: *Deleted

## 2021-04-29 ENCOUNTER — Inpatient Hospital Stay
Admission: RE | Admit: 2021-04-29 | Discharge: 2021-04-29 | Disposition: A | Discharge status: Home / Self Care | Payer: Self-pay | Source: Ambulatory Visit | Attending: *Deleted | Admitting: *Deleted

## 2021-04-29 DIAGNOSIS — Z1231 Encounter for screening mammogram for malignant neoplasm of breast: Secondary | ICD-10-CM

## 2021-05-03 ENCOUNTER — Other Ambulatory Visit: Payer: Self-pay | Admitting: Gerontology

## 2021-05-03 DIAGNOSIS — N6489 Other specified disorders of breast: Secondary | ICD-10-CM

## 2021-06-27 ENCOUNTER — Other Ambulatory Visit: Payer: Self-pay

## 2021-06-27 ENCOUNTER — Encounter: Payer: Self-pay | Admitting: Emergency Medicine

## 2021-06-27 ENCOUNTER — Ambulatory Visit
Admission: EM | Admit: 2021-06-27 | Discharge: 2021-06-27 | Disposition: A | Discharge status: Home / Self Care | Payer: 59 | Attending: Emergency Medicine | Admitting: Emergency Medicine

## 2021-06-27 DIAGNOSIS — L02411 Cutaneous abscess of right axilla: Secondary | ICD-10-CM

## 2021-06-27 MED ORDER — DOXYCYCLINE HYCLATE 100 MG PO CAPS: 100.0000 mg | ORAL_CAPSULE | Freq: Two times a day (BID) | ORAL | 0 refills | Status: DC

## 2021-06-27 NOTE — ED Provider Notes (Signed)
[facility]    CSN: [id number] Arrival date & time: 06/27/21  1733      History   Chief Complaint Chief Complaint  Patient presents with   Abscess    HPI Toni Armstrong is a 41 y.o. female.   Patient presents with abscess to the arm for 3 weeks, area has become swollen and tender over the last week, draining intermittently, last occurrence of drainage 2 days ago..  Denies fever, chills.  Has not attempted treatment.  Endorses that she shaves underarms.  Past Medical History:  Diagnosis Date   Asthma     There are no problems to display for this patient.   History reviewed. No pertinent surgical history.  OB History   No obstetric history on file.      Home Medications    Prior to Admission medications   Medication Sig Start Date End Date Taking? Authorizing Provider  doxycycline (VIBRAMYCIN) 100 MG capsule Take 1 capsule (100 mg total) by mouth 2 (two) times daily. 06/27/21  Yes Shin Lamour R, NP  albuterol (VENTOLIN HFA) 108 (90 Base) MCG/ACT inhaler Inhale into the lungs.    [provider]  cyclobenzaprine (FLEXERIL) 10 MG tablet Take 1 tablet (10 mg total) by mouth 2 (two) times daily as needed for muscle spasms. 06/02/20   Delton See, MD  ketorolac (TORADOL) 10 MG tablet Take 1 tablet (10 mg total) by mouth every 6 (six) hours as needed for moderate pain or severe pain. 10/24/19   Tommie Sams, DO  meloxicam (MOBIC) 15 MG tablet Take 1 tablet (15 mg total) by mouth daily. 06/02/20   Delton See, MD  varenicline (CHANTIX) 1 MG tablet Take 1 mg by mouth 2 (two) times daily.    [provider]    Family History Family History  Problem Relation Age of Onset   Hypertension Mother    Cancer Father     Social History Social History   Tobacco Use   Smoking status: Every Day    Packs/day: 0.25    Types: Cigarettes   Smokeless tobacco: Never  Vaping Use   Vaping Use: Never used  Substance Use Topics   Alcohol use: Not  Currently    Comment: social   Drug use: No     Allergies   Patient has no known allergies.   Review of Systems Review of Systems  Constitutional: Negative.   Respiratory: Negative.    Cardiovascular: Negative.   Skin:  Positive for wound. Negative for color change, pallor and rash.  Neurological: Negative.     Physical Exam Triage Vital Signs ED Triage Vitals  Enc Vitals Group     BP 06/27/21 1815 (!) 141/98     Pulse Rate 06/27/21 1815 78     Resp 06/27/21 1815 18     Temp 06/27/21 1815 98.4 F (36.9 C)     Temp Source 06/27/21 1815 Oral     SpO2 06/27/21 1815 100 %     Weight 06/27/21 1814 225 lb 15.5 oz (102.5 kg)     Height 06/27/21 1814 5\' 6"  (1.676 m)     Head Circumference --      Peak Flow --      Pain Score 06/27/21 1813 6     Pain Loc --      Pain Edu? --      Excl. in GC? --    No data found.  Updated Vital Signs BP (!) 141/98 (BP Location: Left Arm)  Pulse 78    Temp 98.4 F (36.9 C) (Oral)    Resp 18    Ht 5\' 6"  (1.676 m)    Wt 225 lb 15.5 oz (102.5 kg)    LMP 06/22/2021 (Approximate)    SpO2 100%    BMI 36.47 kg/m   Visual Acuity Right Eye Distance:   Left Eye Distance:   Bilateral Distance:    Right Eye Near:   Left Eye Near:    Bilateral Near:     Physical Exam Constitutional:      Appearance: Normal appearance.  HENT:     Head: Normocephalic.  Eyes:     Extraocular Movements: Extraocular movements intact.  Pulmonary:     Effort: Pulmonary effort is normal.  Skin:    Comments: 1 x 2 cm abscess present to the center of the right axilla  Neurological:     Mental Status: She is alert and oriented to person, place, and time. Mental status is at baseline.  Psychiatric:        Mood and Affect: Mood normal.        Behavior: Behavior normal.     UC Treatments / Results  Labs (all labs ordered are listed, but only abnormal results are displayed) Labs Reviewed - No data to display  EKG   Radiology No results  found.  Procedures Procedures (including critical care time)  Medications Ordered in UC Medications - No data to display  Initial Impression / Assessment and Plan / UC Course  I have reviewed the triage vital signs and the nursing notes.  Pertinent labs & imaging results that were available during my care of the patient were reviewed by me and considered in my medical decision making (see chart for details).  Abscess of axilla, right  Puncture and aspirate completed, scant pus and an ingrown hair removed from the site, doxycycline 7-day course prescribed recommended warm compresses to the affected area is much as possible, over-the-counter Tylenol and ibuprofen for pain, given strict precautions to return urgent care for nonhealing site Final Clinical Impressions(s) / UC Diagnoses   Final diagnoses:  Abscess of axilla, right     Discharge Instructions      Take doxycycline twice a day for the next 7 days  Hold warm-hot compresses to affected area at least 4 times a day, this helps to facilitate draining, the more the better  Please return for evaluation for increased swelling, increased tenderness or pain, non healing site, non draining site, you begin to have fever or chills   If continuing to shave begin exfoliation prior to with a rough textured washcloth making circular motions with your regular soap and water, once hair begins to grow back, continue to exfoliate so that hair can grow out of skin properly  We reviewed the etiology of recurrent abscesses of skin.  Skin abscesses are collections of pus within the dermis and deeper skin tissues. Skin abscesses manifest as painful, tender, fluctuant, and erythematous nodules, frequently surmounted by a pustule and surrounded by a rim of erythematous swelling.  Spontaneous drainage of purulent material may occur.  Fever can occur on occasion.    -Skin abscesses can develop in healthy individuals with no predisposing conditions  other than skin or nasal carriage of Staphylococcus aureus.  Individuals in close contact with others who have active infection with skin abscesses are at increased risk which is likely to explain why twin brother has similar episodes.   In addition, any process leading to a  breach in the skin barrier can also predispose to the development of a skin abscesses, such as atopic dermatitis.      ED Prescriptions     Medication Sig Dispense Auth. Provider   doxycycline (VIBRAMYCIN) 100 MG capsule Take 1 capsule (100 mg total) by mouth 2 (two) times daily. 14 capsule Derry Arbogast, Elita Boone, NP      PDMP not reviewed this encounter.   Valinda Hoar, [place] 06/27/21 [phone]

## 2021-06-27 NOTE — Discharge Instructions (Signed)
Take doxycycline twice a day for the next 7 days  Hold warm-hot compresses to affected area at least 4 times a day, this helps to facilitate draining, the more the better  Please return for evaluation for increased swelling, increased tenderness or pain, non healing site, non draining site, you begin to have fever or chills   If continuing to shave begin exfoliation prior to with a rough textured washcloth making circular motions with your regular soap and water, once hair begins to grow back, continue to exfoliate so that hair can grow out of skin properly  We reviewed the etiology of recurrent abscesses of skin.  Skin abscesses are collections of pus within the dermis and deeper skin tissues. Skin abscesses manifest as painful, tender, fluctuant, and erythematous nodules, frequently surmounted by a pustule and surrounded by a rim of erythematous swelling.  Spontaneous drainage of purulent material may occur.  Fever can occur on occasion.    -Skin abscesses can develop in healthy individuals with no predisposing conditions other than skin or nasal carriage of Staphylococcus aureus.  Individuals in close contact with others who have active infection with skin abscesses are at increased risk which is likely to explain why twin brother has similar episodes.   In addition, any process leading to a breach in the skin barrier can also predispose to the development of a skin abscesses, such as atopic dermatitis.

## 2021-06-27 NOTE — ED Triage Notes (Signed)
Pt has abscess in her right axilla. Started about 3 weeks ago but has gotten more painful in the last week. She states it started draining about 2 days ago.

## 2021-09-07 ENCOUNTER — Encounter: Payer: Self-pay | Admitting: Emergency Medicine

## 2021-09-07 ENCOUNTER — Emergency Department
Admission: EM | Admit: 2021-09-07 | Discharge: 2021-09-08 | Disposition: A | Discharge status: Home / Self Care | Payer: 59 | Attending: Emergency Medicine | Admitting: Emergency Medicine

## 2021-09-07 DIAGNOSIS — Z720 Tobacco use: Secondary | ICD-10-CM | POA: Insufficient documentation

## 2021-09-07 DIAGNOSIS — J4520 Mild intermittent asthma, uncomplicated: Secondary | ICD-10-CM | POA: Diagnosis not present

## 2021-09-07 DIAGNOSIS — L089 Local infection of the skin and subcutaneous tissue, unspecified: Secondary | ICD-10-CM | POA: Diagnosis present

## 2021-09-07 DIAGNOSIS — R600 Localized edema: Secondary | ICD-10-CM | POA: Insufficient documentation

## 2021-09-07 DIAGNOSIS — M7989 Other specified soft tissue disorders: Secondary | ICD-10-CM | POA: Diagnosis not present

## 2021-09-07 MED ORDER — MUPIROCIN 2 % EX OINT: 1.0000 "application " | TOPICAL_OINTMENT | Freq: Two times a day (BID) | CUTANEOUS | 0 refills | Status: AC

## 2021-09-07 NOTE — Discharge Instructions (Addendum)
You should apply the antibiotic ointment to the area of redness. Please also do warm soaks several times a day. If the area of redness is spreading, please return to the emergency department if the redness is expanding.  ?

## 2021-09-07 NOTE — ED Triage Notes (Signed)
Pt presents via POV with complaints of an insect bite to the right posterior ankle that occurred either last night or this AM. Visible swelling and redness noted - pt has a circle drawn over the initial bite which the redness has spread past the line over the last hour. ?

## 2021-09-08 ENCOUNTER — Ambulatory Visit
Admission: EM | Admit: 2021-09-08 | Discharge: 2021-09-08 | Disposition: A | Discharge status: Home / Self Care | Payer: 59 | Source: Home / Self Care

## 2021-09-08 DIAGNOSIS — R600 Localized edema: Secondary | ICD-10-CM

## 2021-09-08 DIAGNOSIS — M7989 Other specified soft tissue disorders: Secondary | ICD-10-CM | POA: Insufficient documentation

## 2021-09-08 LAB — URINALYSIS, MICROSCOPIC (REFLEX)

## 2021-09-08 LAB — COMPREHENSIVE METABOLIC PANEL
ALT: 12 U/L (ref 0–44)
AST: 14 U/L — ABNORMAL LOW (ref 15–41)
Albumin: 3.9 g/dL (ref 3.5–5.0)
Alkaline Phosphatase: 72 U/L (ref 38–126)
Anion gap: 6 (ref 5–15)
BUN: 11 mg/dL (ref 6–20)
CO2: 26 mmol/L (ref 22–32)
Calcium: 8.6 mg/dL — ABNORMAL LOW (ref 8.9–10.3)
Chloride: 103 mmol/L (ref 98–111)
Creatinine, Ser: 0.76 mg/dL (ref 0.44–1.00)
GFR, Estimated: >60 mL/min (ref >60)
Glucose, Bld: 92 mg/dL (ref 70–99)
Potassium: 3.5 mmol/L (ref 3.5–5.1)
Sodium: 135 mmol/L (ref 135–145)
Total Bilirubin: <0.1 mg/dL — ABNORMAL LOW (ref 0.3–1.2)
Total Protein: 7.3 g/dL (ref 6.5–8.1)

## 2021-09-08 LAB — URINALYSIS, ROUTINE W REFLEX MICROSCOPIC
Bilirubin Urine: NEGATIVE
Glucose, UA: NEGATIVE mg/dL
Ketones, ur: NEGATIVE mg/dL
Leukocytes,Ua: NEGATIVE
Nitrite: POSITIVE — AB
Protein, ur: NEGATIVE mg/dL
Specific Gravity, Urine: 1.025 (ref 1.005–1.030)
pH: 5.5 (ref 5.0–8.0)

## 2021-09-08 MED ORDER — FUROSEMIDE 20 MG PO TABS: 20.0000 mg | ORAL_TABLET | Freq: Every day | ORAL | 0 refills | Status: AC

## 2021-09-08 NOTE — ED Provider Notes (Signed)
?[facility] ? ? ? ?CSN: [id number] ?Arrival date & time: 09/08/21  1423 ? ? ?  ? ?History   ?Chief Complaint ?Chief Complaint  ?Patient presents with  ? Skin Problem  ? ? ?HPI ?Toni Armstrong is a 41 y.o. female.  ? ?HPI ? ?41 year old female here for evaluation of bilateral feet swelling. ? ?Patient reports that she noticed her feet were swollen yesterday.  She also thinks that they are little red in color.  She noticed an insect bite on the back of her right ankle and she is unsure of whether or not that is causing the symptoms.  She states that when she went to bed last night the swelling improved but they are still swollen this morning and they continue to swell as the day has gone on.  She does not have any history of peripheral swelling in the past.  She denies any chest pain or shortness of breath.  She is not on any new medications.  She states that she is no longer taking the Wellbutrin.  She has not had any fever, sweats, or chills.  No numbness or tingling to her feet.  She also denies calf pain or changes in urination.  She is a smoker and she has a largely sedentary job.  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 ? ?Past Medical History:  ?Diagnosis Date  ? Asthma   ? ? ?Patient Active Problem List  ? Diagnosis Date Noted  ? Missed menses 09/28/2020  ? Vitamin D deficiency 09/26/2019  ? Vitamin B12 deficiency 09/26/2019  ? Leg pain, anterior, right 09/25/2019  ? Tobacco use 08/12/2014  ? Mild intermittent asthma 08/12/2014  ? ? ?History reviewed. No pertinent surgical history. ? ?OB History   ?No obstetric history on file. ?  ? ? ? ?Home Medications   ? ?Prior to Admission medications   ?Medication Sig Start Date End Date Taking? Authorizing Provider  ?buPROPion (WELLBUTRIN SR) 150 MG 12 hr tablet Take 1 tablet by mouth 2 (two) times daily. 09/28/20 09/28/21 Yes [provider]  ?Cholecalciferol 50 MCG  (2000 UT) CAPS Take 3 capsules daily for 3 months, then reduce to 1 capsule daily thereafter for Vitamin D Deficiency. 09/26/19  Yes [provider]  ?cyanocobalamin 1000 MCG tablet Take 2 tablets daily for 2 weeks, then reduce to 1 tablet daily thereafter for Vitamin B12 Deficiency. 09/26/19  Yes [provider]  ?cyclobenzaprine (FLEXERIL) 10 MG tablet Take by mouth. 03/03/16  Yes [provider]  ?furosemide (LASIX) 20 MG tablet Take 1 tablet (20 mg total) by mouth daily. 09/08/21  Yes Margarette Canada, NP  ?ibuprofen (ADVIL) 800 MG tablet Take by mouth. 01/21/16  Yes [provider]  ?albuterol (VENTOLIN HFA) 108 (90 Base) MCG/ACT inhaler Inhale into the lungs.    [provider]  ?cyclobenzaprine (FLEXERIL) 10 MG tablet Take 1 tablet (10 mg total) by mouth 2 (two) times daily as needed for muscle spasms. 06/02/20   Verda Cumins, MD  ?doxycycline (VIBRAMYCIN) 100 MG capsule Take 1 capsule (100 mg total) by mouth 2 (two) times daily. 06/27/21   Hans Eden, NP  ?ID NOW COVID-19 KIT TEST AS DIRECTED TODAY 06/02/21   [provider]  ?ketorolac (TORADOL) 10 MG tablet Take 1 tablet (10 mg total) by mouth every 6 (six) hours as needed for moderate pain or severe pain. 10/24/19  Thersa Salt G, DO  ?meloxicam (MOBIC) 15 MG tablet Take 1 tablet (15 mg total) by mouth daily. 06/02/20   Verda Cumins, MD  ?mupirocin ointment (BACTROBAN) 2 % Apply 1 application. topically 2 (two) times daily for 5 days. 09/07/21 09/12/21  Rada Hay, MD  ?varenicline (CHANTIX) 1 MG tablet Take 1 mg by mouth 2 (two) times daily.    [provider]  ? ? ?Family History ?Family History  ?Problem Relation Age of Onset  ? Hypertension Mother   ? Cancer Father   ? ? ?Social History ?Social History  ? ?Tobacco Use  ? Smoking status: Every Day  ?  Packs/day: 0.25  ?  Types: Cigarettes  ? Smokeless tobacco: Never  ?Vaping Use  ? Vaping Use: Never used  ?Substance Use Topics  ? Alcohol  use: Not Currently  ?  Comment: social  ? Drug use: No  ? ? ? ?Allergies   ?Patient has no known allergies. ? ? ?Review of Systems ?Review of Systems  ?Constitutional:  Negative for chills, diaphoresis and fever.  ?Respiratory:  Negative for shortness of breath.   ?Cardiovascular:  Positive for leg swelling. Negative for chest pain and palpitations.  ?Musculoskeletal:  Negative for myalgias.  ?Neurological:  Negative for weakness and numbness.  ?Hematological: Negative.   ?Psychiatric/Behavioral: Negative.    ? ? ?Physical Exam ?Triage Vital Signs ?ED Triage Vitals  ?Enc Vitals Group  ?   BP 09/08/21 1434 134/83  ?   Pulse Rate 09/08/21 1434 88  ?   Resp 09/08/21 1434 18  ?   Temp 09/08/21 1434 98.3 ?F (36.8 ?C)  ?   Temp Source 09/08/21 1434 Oral  ?   SpO2 09/08/21 1434 100 %  ?   Weight 09/08/21 1433 243 lb (110.2 kg)  ?   Height 09/08/21 1433 5' 6.5" (1.689 m)  ?   Head Circumference --   ?   Peak Flow --   ?   Pain Score 09/08/21 1428 8  ?   Pain Loc --   ?   Pain Edu? --   ?   Excl. in [place]? --   ? ?No data found. ? ?Updated Vital Signs ?BP 134/83 (BP Location: Left Arm)   Pulse 88   Temp 98.3 ?F (36.8 ?C) (Oral)   Resp 18   Ht 5' 6.5" (1.689 m)   Wt 243 lb (110.2 kg)   LMP 09/05/2021 (Exact Date)   SpO2 100%   BMI 38.63 kg/m?  ? ?Visual Acuity ?Right Eye Distance:   ?Left Eye Distance:   ?Bilateral Distance:   ? ?Right Eye Near:   ?Left Eye Near:    ?Bilateral Near:    ? ?Physical Exam ?Vitals and nursing note reviewed.  ?Constitutional:   ?   Appearance: Normal appearance. She is not ill-appearing.  ?HENT:  ?   Head: Normocephalic and atraumatic.  ?Cardiovascular:  ?   Rate and Rhythm: Normal rate and regular rhythm.  ?   Pulses: Normal pulses.  ?   Heart sounds: Normal heart sounds. No murmur heard. ?  No friction rub. No gallop.  ?Pulmonary:  ?   Effort: Pulmonary effort is normal.  ?   Breath sounds: Normal breath sounds. No wheezing, rhonchi or rales.  ?Musculoskeletal:  ?   Right lower leg: Edema  present.  ?   Left lower leg: Edema present.  ?Skin: ?   General: Skin is warm and dry.  ?   Capillary Refill:  Capillary refill takes less than 2 seconds.  ?   Findings: No erythema.  ?Neurological:  ?   General: No focal deficit present.  ?   Mental Status: She is alert and oriented to person, place, and time.  ?   Sensory: No sensory deficit.  ?   Motor: No weakness.  ?Psychiatric:     ?   Mood and Affect: Mood normal.     ?   Behavior: Behavior normal.     ?   Thought Content: Thought content normal.     ?   Judgment: Judgment normal.  ? ? ? ?UC Treatments / Results  ?Labs ?(all labs ordered are listed, but only abnormal results are displayed) ?Labs Reviewed  ?COMPREHENSIVE METABOLIC PANEL - Abnormal; Notable for the following components:  ?    Result Value  ? Calcium 8.6 (*)   ? AST 14 (*)   ? Total Bilirubin <0.1 (*)   ? All other components within normal limits  ?URINALYSIS, ROUTINE W REFLEX MICROSCOPIC - Abnormal; Notable for the following components:  ? APPearance HAZY (*)   ? Hgb urine dipstick LARGE (*)   ? Nitrite POSITIVE (*)   ? All other components within normal limits  ?URINALYSIS, MICROSCOPIC (REFLEX) - Abnormal; Notable for the following components:  ? Bacteria, UA MANY (*)   ? All other components within normal limits  ? ? ?EKG ?Normal sinus rhythm with ventricular rate of 68 bpm ?Parable 138 ms ?QRS duration 70 ms ?QT/QTc 396/421 ms ?No ST or T wave abnormalities. ? ? ?Radiology ?No results found. ? ?Procedures ?Procedures (including critical care time) ? ?Medications Ordered in UC ?Medications - No data to display ? ?Initial Impression / Assessment and Plan / UC Course  ?I have reviewed the triage vital signs and the nursing notes. ? ?Pertinent labs & imaging results that were available during my care of the patient were reviewed by me and considered in my medical decision making (see chart for details). ? ?Patient is a nontoxic-appearing 41 year old female here for evaluation of swelling in both  of her feet that has been going on since yesterday.  She does indicates that her feet appear red to her and she knows insect bite on the back part of her right ankle.  On exam patient does have edematous feet and

## 2021-09-08 NOTE — Discharge Instructions (Signed)
Your blood work today did not show any protein or electrolyte deficiencies. ? ?The swelling in your feet could be related to several things including venous insufficiency as result of you being a smoker, aging, sitting for long periods of time at her work impeding venous and lymphatic return. ? ?Buy compression hose and wear them to help provide an external compression to force the fluid back into your blood vessels so that your body can eliminate it through your kidneys. ? ?Take the Lasix 20 mg once daily for the next 4 days to help alleviate the excess fluid. ? ?Make a follow-up appointment with your primary care provider to discuss the swelling in your feet if it continues. ?

## 2021-09-08 NOTE — ED Triage Notes (Signed)
Patient is here for "insect bite, back of right ankle, but both feet are swollen and red". No fever. First noticed yesterday morning.  ?

## 2021-09-09 NOTE — ED Provider Notes (Signed)
? ?[facility] ?Provider Note ? ? ? Event Date/Time  ? First MD Initiated Contact with Patient 09/07/21 2243   ?  (approximate) ? ? ?History  ? ?Insect Bite ? ? ?HPI ? ?Toni Armstrong is a 41 y.o. female with no significant past medical history presents with left foot redness and insect bite.  Patient started to notice some burning on the left posterior foot/Achilles region.  She saw 3 red spots which she has circled.  Thinks that she was bit by an insect.  Denies fevers chills.  There was some pus draining from one of them ?  ? ?Past Medical History:  ?Diagnosis Date  ? Asthma   ? ? ?Patient Active Problem List  ? Diagnosis Date Noted  ? Missed menses 09/28/2020  ? Vitamin D deficiency 09/26/2019  ? Vitamin B12 deficiency 09/26/2019  ? Leg pain, anterior, right 09/25/2019  ? Tobacco use 08/12/2014  ? Mild intermittent asthma 08/12/2014  ? ? ? ?Physical Exam  ?Triage Vital Signs: ?ED Triage Vitals  ?Enc Vitals Group  ?   BP 09/07/21 2235 (!) 142/97  ?   Pulse Rate 09/07/21 2235 69  ?   Resp 09/07/21 2235 18  ?   Temp 09/07/21 2235 98.3 ?F (36.8 ?C)  ?   Temp src --   ?   SpO2 09/07/21 2235 100 %  ?   Weight 09/07/21 2236 227 lb 1.2 oz (103 kg)  ?   Height 09/07/21 2236 5\' 6"  (1.676 m)  ?   Head Circumference --   ?   Peak Flow --   ?   Pain Score 09/07/21 2235 5  ?   Pain Loc --   ?   Pain Edu? --   ?   Excl. in [place]? --   ? ? ?Most recent vital signs: ?Vitals:  ? 09/07/21 2235 09/08/21 0006  ?BP: (!) 142/97 116/84  ?Pulse: 69 64  ?Resp: 18 14  ?Temp: 98.3 ?F (36.8 ?C)   ?SpO2: 100% 100%  ? ? ? ?General: Awake, no distress.  ?CV:  Good peripheral perfusion.  ?Resp:  Normal effort.  ?Abd:  No distention.  ?Neuro:             Awake, Alert, Oriented x 3  ?Other:  3 small areas of erythema on the left posterior ankle the central 1 has a very small purulent head that I expressed manually with a very small drainage amount there is no surrounding cellulitis ? ? ?ED Results / Procedures / Treatments   ?Labs ?(all labs ordered are listed, but only abnormal results are displayed) ?Labs Reviewed - No data to display ? ? ?EKG ? ? ? ? ?RADIOLOGY ? ? ? ?PROCEDURES: ? ?Critical Care performed: No ? ?Procedures ? ? ?MEDICATIONS ORDERED IN ED: ?Medications - No data to display ? ? ?IMPRESSION / MDM / ASSESSMENT AND PLAN / ED COURSE  ?I reviewed the triage vital signs and the nursing notes. ?             ?               ? ?Differential diagnosis includes, but is not limited to, insect bite, folliculitis, cellulitis ? ?Patient is a 41 year old female presents with concern for an insect bite to the left posterior ankle.  Did not actually see an insect but noticed 3 red spots that were burning.  On exam she has 3 discrete areas of erythema in the central area has  a small pustule that I expressed manually.  There does not appear to be significant surrounding cellulitis.  Could be insect bite versus a folliculitis.  I think that at this point would be most appropriate to do topical antibiotics and warm compresses as I do not think she needs oral antibiotics but I did discuss that if the area of redness and reddening that she returns as she may need antibiotics at this point.  Will prescribe mupirocin. ? ?  ? ? ?FINAL CLINICAL IMPRESSION(S) / ED DIAGNOSES  ? ?Final diagnoses:  ?Skin pustule  ? ? ? ?Rx / DC Orders  ? ?ED Discharge Orders   ? ?      Ordered  ?  mupirocin ointment (BACTROBAN) 2 %  2 times daily       ? 09/07/21 2305  ? ?  ?  ? ?  ? ? ? ?Note:  This document was prepared using Dragon voice recognition software and may include unintentional dictation errors. ?  ?Georga Hacking, MD ?09/09/21 1845 ? ?

## 2021-10-04 ENCOUNTER — Ambulatory Visit
Admission: EM | Admit: 2021-10-04 | Discharge: 2021-10-04 | Disposition: A | Discharge status: Home / Self Care | Payer: 59 | Attending: Physician Assistant | Admitting: Physician Assistant

## 2021-10-04 DIAGNOSIS — R051 Acute cough: Secondary | ICD-10-CM | POA: Insufficient documentation

## 2021-10-04 DIAGNOSIS — F17210 Nicotine dependence, cigarettes, uncomplicated: Secondary | ICD-10-CM | POA: Diagnosis not present

## 2021-10-04 DIAGNOSIS — B349 Viral infection, unspecified: Secondary | ICD-10-CM | POA: Insufficient documentation

## 2021-10-04 DIAGNOSIS — J45909 Unspecified asthma, uncomplicated: Secondary | ICD-10-CM | POA: Diagnosis not present

## 2021-10-04 DIAGNOSIS — Z20822 Contact with and (suspected) exposure to covid-19: Secondary | ICD-10-CM | POA: Insufficient documentation

## 2021-10-04 DIAGNOSIS — R32 Unspecified urinary incontinence: Secondary | ICD-10-CM | POA: Insufficient documentation

## 2021-10-04 DIAGNOSIS — R0981 Nasal congestion: Secondary | ICD-10-CM | POA: Diagnosis present

## 2021-10-04 LAB — SARS CORONAVIRUS 2 BY RT PCR: SARS Coronavirus 2 by RT PCR: NEGATIVE

## 2021-10-04 MED ORDER — GUAIFENESIN-CODEINE 100-10 MG/5ML PO SYRP: 10.0000 mL | ORAL_SOLUTION | Freq: Three times a day (TID) | ORAL | 0 refills | Status: AC | PRN

## 2021-10-04 MED ORDER — IPRATROPIUM BROMIDE 0.06 % NA SOLN: 2.0000 | Freq: Four times a day (QID) | NASAL | 0 refills | Status: AC

## 2021-10-04 NOTE — ED Triage Notes (Signed)
Pt had a pneumonia shot on Thursday and after she had a fever of 100.3, Body Chills, Cough x4days.   Pt is not worried about Covid.

## 2021-10-04 NOTE — Discharge Instructions (Addendum)
-  We will call if your COVID test is positive.  If it is positive I can send antiviral medicine to the pharmacy for you.  You would need to isolate 5 days from symptom onset was Sunday being the first day and then wear a mask x5 days. - If you do not hear from [place], the results negative and it is likely another viral illness, probably what your boyfriend has or possibly related to the pneumonia shot you received. - In any case, have sent cough medicine to the pharmacy and nasal spray.  Increase rest and fluids. - Use your inhalers if needed for shortness of breath. -Go to ER for any acute worsening of your symptoms especially if you have breathing difficulty not relieved by use of your inhalers. - You should be feeling better over the next 1 week but if you are not or your symptoms worsen, please return.

## 2021-10-04 NOTE — ED Provider Notes (Signed)
[facility]    CSN: [id number] Arrival date & time: 10/04/21  1219      History   Chief Complaint Chief Complaint  Patient presents with   Cough   Fever   Nasal Congestion    HPI Toni Armstrong is a 41 y.o. female presenting for 3-day history of temps up to 100.3 degrees, body aches and chills, cough, nasal congestion and fatigue.  Patient reports she has broken the fever over the past day.  She denies any associated sore throat, chest pain, breathing difficulty and no reports of nausea/vomiting or diarrhea.  She reports her boyfriend is sick with similar symptoms.  He got sick after attending bike week.  She also reports that she received a pneumonia vaccine 2 days before onset of symptoms.  Her medical history is significant for asthma but she has not needed to take her inhalers any more frequently than normal.  She smokes every day.  She has been taking over-the-counter Robitussin but says her cough is not well controlled she is having incontinence issues related to the coughing.  No other complaints.  HPI  Past Medical History:  Diagnosis Date   Asthma     Patient Active Problem List   Diagnosis Date Noted   Missed menses 09/28/2020   Vitamin D deficiency 09/26/2019   Vitamin B12 deficiency 09/26/2019   Leg pain, anterior, right 09/25/2019   Tobacco use 08/12/2014   Mild intermittent asthma 08/12/2014    History reviewed. No pertinent surgical history.  OB History   No obstetric history on file.      Home Medications    Prior to Admission medications   Medication Sig Start Date End Date Taking? Authorizing Provider  guaiFENesin-codeine (ROBITUSSIN AC) 100-10 MG/5ML syrup Take 10 mLs by mouth 3 (three) times daily as needed for cough. 10/04/21  Yes Laurene Footman B, PA-C  ipratropium (ATROVENT) 0.06 % nasal spray Place 2 sprays into both nostrils 4 (four) times daily. 10/04/21  Yes Laurene Footman B, PA-C  albuterol (VENTOLIN HFA) 108 (90 Base) MCG/ACT inhaler  Inhale into the lungs.    [provider]  buPROPion (WELLBUTRIN SR) 150 MG 12 hr tablet Take 1 tablet by mouth 2 (two) times daily. 09/28/20 09/28/21  [provider]  Cholecalciferol 50 MCG (2000 UT) CAPS Take 3 capsules daily for 3 months, then reduce to 1 capsule daily thereafter for Vitamin D Deficiency. 09/26/19   [provider]  cyanocobalamin 1000 MCG tablet Take 2 tablets daily for 2 weeks, then reduce to 1 tablet daily thereafter for Vitamin B12 Deficiency. 09/26/19   [provider]  furosemide (LASIX) 20 MG tablet Take 1 tablet (20 mg total) by mouth daily. 09/08/21   Margarette Canada, NP  ID NOW COVID-19 KIT TEST AS DIRECTED TODAY 06/02/21   [provider]  varenicline (CHANTIX) 1 MG tablet Take 1 mg by mouth 2 (two) times daily.    [provider]    Family History Family History  Problem Relation Age of Onset   Hypertension Mother    Cancer Father     Social History Social History   Tobacco Use   Smoking status: Every Day    Packs/day: 0.25    Types: Cigarettes   Smokeless tobacco: Never  Vaping Use   Vaping Use: Never used  Substance Use Topics   Alcohol use: Not Currently    Comment: social   Drug use: No     Allergies   Patient has no  known allergies.   Review of Systems Review of Systems  Constitutional:  Positive for chills, fatigue and fever. Negative for diaphoresis.  HENT:  Positive for congestion and rhinorrhea. Negative for ear pain, sinus pressure, sinus pain and sore throat.   Respiratory:  Positive for cough. Negative for shortness of breath.   Gastrointestinal:  Negative for abdominal pain, nausea and vomiting.  Musculoskeletal:  Positive for myalgias. Negative for arthralgias.  Skin:  Negative for rash.  Neurological:  Negative for weakness and headaches.  Hematological:  Negative for adenopathy.    Physical Exam Triage Vital Signs ED Triage Vitals  Enc Vitals Group     BP      Pulse       Resp      Temp      Temp src      SpO2      Weight      Height      Head Circumference      Peak Flow      Pain Score      Pain Loc      Pain Edu?      Excl. in [place]?    No data found.  Updated Vital Signs BP 121/77 (BP Location: Left Arm)   Pulse 84   Temp 98.7 F (37.1 C) (Oral)   Resp 18   Ht 5' 6"  (1.676 m)   Wt 243 lb (110.2 kg)   LMP 10/04/2021 (Exact Date)   SpO2 97%   BMI 39.22 kg/m      Physical Exam Vitals and nursing note reviewed.  Constitutional:      General: She is not in acute distress.    Appearance: Normal appearance. She is ill-appearing. She is not toxic-appearing.  HENT:     Head: Normocephalic and atraumatic.     Nose: Congestion present.     Mouth/Throat:     Mouth: Mucous membranes are moist.     Pharynx: Oropharynx is clear.  Eyes:     General: No scleral icterus.       Right eye: No discharge.        Left eye: No discharge.     Conjunctiva/sclera: Conjunctivae normal.  Cardiovascular:     Rate and Rhythm: Normal rate and regular rhythm.     Heart sounds: Normal heart sounds.  Pulmonary:     Effort: Pulmonary effort is normal. No respiratory distress.     Breath sounds: Normal breath sounds. No wheezing, rhonchi or rales.  Musculoskeletal:     Cervical back: Neck supple.  Skin:    General: Skin is dry.  Neurological:     General: No focal deficit present.     Mental Status: She is alert. Mental status is at baseline.     Motor: No weakness.     Gait: Gait normal.  Psychiatric:        Mood and Affect: Mood normal.        Behavior: Behavior normal.        Thought Content: Thought content normal.     UC Treatments / Results  Labs (all labs ordered are listed, but only abnormal results are displayed) Labs Reviewed  SARS CORONAVIRUS 2 BY RT PCR    EKG   Radiology No results found.  Procedures Procedures (including critical care time)  Medications Ordered in UC Medications - No data to display  Initial Impression /  Assessment and Plan / UC Course  I have reviewed the triage vital signs and  the nursing notes.  Pertinent labs & imaging results that were available during my care of the patient were reviewed by me and considered in my medical decision making (see chart for details).  41 year old female presenting for low-grade fever, fatigue, aches, cough and congestion which started 3 days ago.  Boyfriend sick with similar symptoms.  She also received pneumonia vaccine 2 days before onset of symptoms.  Vitals normal and stable.  She is ill-appearing but nontoxic.  On exam she does have nasal congestion with slight clear drainage.  Chest clear to auscultation heart regular rate and rhythm.  PCR COVID test obtained.  Advised patient we will call if results are positive and sent antiviral medication.  Reviewed current CDC guidelines, isolation protocol and ED precautions if COVID-positive.  Advised patient if she does not receive a call from [place], the result is negative and she likely has what her boyfriend has or possibly could be related to the pneumonia vaccine.  In any case I expect she should be feeling better over the next 1 week and I have sent Cheratussin after reviewing controlled substance database and Atrovent nasal spray.  Encouraged plenty of rest and fluids.  Reviewed return and ER precautions.  Negative COVID test.   Final Clinical Impressions(s) / UC Diagnoses   Final diagnoses:  Viral illness  Acute cough  Nasal congestion     Discharge Instructions      -We will call if your COVID test is positive.  If it is positive I can send antiviral medicine to the pharmacy for you.  You would need to isolate 5 days from symptom onset was Sunday being the first day and then wear a mask x5 days. - If you do not hear from [place], the results negative and it is likely another viral illness, probably what your boyfriend has or possibly related to the pneumonia shot you received. - In any case, have sent  cough medicine to the pharmacy and nasal spray.  Increase rest and fluids. - Use your inhalers if needed for shortness of breath. -Go to ER for any acute worsening of your symptoms especially if you have breathing difficulty not relieved by use of your inhalers. - You should be feeling better over the next 1 week but if you are not or your symptoms worsen, please return.     ED Prescriptions     Medication Sig Dispense Auth. Provider   guaiFENesin-codeine (ROBITUSSIN AC) 100-10 MG/5ML syrup Take 10 mLs by mouth 3 (three) times daily as needed for cough. 120 mL Laurene Footman B, PA-C   ipratropium (ATROVENT) 0.06 % nasal spray Place 2 sprays into both nostrils 4 (four) times daily. 15 mL Danton Clap, PA-C      I have reviewed the PDMP during this encounter.   Danton Clap, PA-C 10/04/21 1435
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
